# Patient Record
Sex: Female | Born: 1935 | Race: White | Hispanic: No | Smoking: Never smoker
Health system: Southern US, Community
[De-identification: ages and names within clinical notes are randomized; demographics above are authoritative.]

## PROBLEM LIST (undated history)

## (undated) DIAGNOSIS — S329XXA Fracture of unspecified parts of lumbosacral spine and pelvis, initial encounter for closed fracture: Secondary | ICD-10-CM

## (undated) DIAGNOSIS — I10 Essential (primary) hypertension: Secondary | ICD-10-CM

## (undated) DIAGNOSIS — I5031 Acute diastolic (congestive) heart failure: Secondary | ICD-10-CM

## (undated) DIAGNOSIS — I639 Cerebral infarction, unspecified: Secondary | ICD-10-CM

## (undated) DIAGNOSIS — I4891 Unspecified atrial fibrillation: Secondary | ICD-10-CM

## (undated) DIAGNOSIS — M1990 Unspecified osteoarthritis, unspecified site: Secondary | ICD-10-CM

## (undated) DIAGNOSIS — E785 Hyperlipidemia, unspecified: Secondary | ICD-10-CM

## (undated) DIAGNOSIS — R7302 Impaired glucose tolerance (oral): Secondary | ICD-10-CM

## (undated) HISTORY — DX: Impaired glucose tolerance (oral): R73.02

## (undated) HISTORY — DX: Essential (primary) hypertension: I10

## (undated) HISTORY — DX: Acute diastolic (congestive) heart failure: I50.31

## (undated) HISTORY — DX: Unspecified osteoarthritis, unspecified site: M19.90

## (undated) HISTORY — DX: Unspecified atrial fibrillation: I48.91

## (undated) HISTORY — DX: Hyperlipidemia, unspecified: E78.5

---

## 1999-05-14 HISTORY — PX: CATARACT EXTRACTION W/ INTRAOCULAR LENS  IMPLANT, BILATERAL: SHX1307

## 2007-02-07 ENCOUNTER — Inpatient Hospital Stay (HOSPITAL_COMMUNITY): Admission: EM | Admit: 2007-02-07 | Discharge: 2007-02-15 | Payer: Self-pay | Admitting: Emergency Medicine

## 2007-02-21 ENCOUNTER — Inpatient Hospital Stay (HOSPITAL_COMMUNITY): Admission: EM | Admit: 2007-02-21 | Discharge: 2007-03-05 | Payer: Self-pay | Admitting: Emergency Medicine

## 2007-02-23 ENCOUNTER — Encounter (INDEPENDENT_AMBULATORY_CARE_PROVIDER_SITE_OTHER): Payer: Self-pay | Admitting: Gastroenterology

## 2007-03-02 ENCOUNTER — Encounter (INDEPENDENT_AMBULATORY_CARE_PROVIDER_SITE_OTHER): Payer: Self-pay | Admitting: General Surgery

## 2007-03-02 HISTORY — PX: LAPAROSCOPIC CHOLECYSTECTOMY: SUR755

## 2007-03-06 ENCOUNTER — Inpatient Hospital Stay (HOSPITAL_COMMUNITY): Admission: EM | Admit: 2007-03-06 | Discharge: 2007-03-18 | Payer: Self-pay | Admitting: Emergency Medicine

## 2007-03-08 ENCOUNTER — Encounter (INDEPENDENT_AMBULATORY_CARE_PROVIDER_SITE_OTHER): Payer: Self-pay | Admitting: Gastroenterology

## 2007-03-24 ENCOUNTER — Inpatient Hospital Stay (HOSPITAL_COMMUNITY): Admission: EM | Admit: 2007-03-24 | Discharge: 2007-03-26 | Payer: Self-pay | Admitting: Endocrinology

## 2010-05-01 ENCOUNTER — Encounter (INDEPENDENT_AMBULATORY_CARE_PROVIDER_SITE_OTHER): Payer: Self-pay | Admitting: Internal Medicine

## 2010-05-01 ENCOUNTER — Ambulatory Visit: Payer: Self-pay | Admitting: Internal Medicine

## 2010-05-01 ENCOUNTER — Inpatient Hospital Stay (HOSPITAL_COMMUNITY): Admission: EM | Admit: 2010-05-01 | Discharge: 2010-05-11 | Payer: Self-pay | Admitting: Emergency Medicine

## 2010-05-03 ENCOUNTER — Encounter (INDEPENDENT_AMBULATORY_CARE_PROVIDER_SITE_OTHER): Payer: Self-pay | Admitting: Internal Medicine

## 2010-05-04 ENCOUNTER — Encounter (INDEPENDENT_AMBULATORY_CARE_PROVIDER_SITE_OTHER): Payer: Self-pay | Admitting: Internal Medicine

## 2010-05-24 DIAGNOSIS — E739 Lactose intolerance, unspecified: Secondary | ICD-10-CM

## 2010-05-24 DIAGNOSIS — M1990 Unspecified osteoarthritis, unspecified site: Secondary | ICD-10-CM | POA: Insufficient documentation

## 2010-05-24 DIAGNOSIS — M810 Age-related osteoporosis without current pathological fracture: Secondary | ICD-10-CM | POA: Insufficient documentation

## 2010-05-24 DIAGNOSIS — I10 Essential (primary) hypertension: Secondary | ICD-10-CM | POA: Insufficient documentation

## 2010-05-24 DIAGNOSIS — E785 Hyperlipidemia, unspecified: Secondary | ICD-10-CM

## 2010-05-26 ENCOUNTER — Ambulatory Visit: Payer: Self-pay | Admitting: Internal Medicine

## 2010-05-26 ENCOUNTER — Encounter: Payer: Self-pay | Admitting: Physician Assistant

## 2010-05-26 DIAGNOSIS — I4891 Unspecified atrial fibrillation: Secondary | ICD-10-CM

## 2010-05-26 DIAGNOSIS — I10 Essential (primary) hypertension: Secondary | ICD-10-CM | POA: Insufficient documentation

## 2010-05-26 DIAGNOSIS — R609 Edema, unspecified: Secondary | ICD-10-CM | POA: Insufficient documentation

## 2010-05-26 DIAGNOSIS — I5033 Acute on chronic diastolic (congestive) heart failure: Secondary | ICD-10-CM

## 2010-05-31 LAB — CONVERTED CEMR LAB
Calcium: 10.4 mg/dL (ref 8.4–10.5)
Chloride: 105 meq/L (ref 96–112)
Creatinine, Ser: 1.3 mg/dL — ABNORMAL HIGH (ref 0.4–1.2)
Potassium: 4.3 meq/L (ref 3.5–5.1)
Sodium: 144 meq/L (ref 135–145)

## 2010-06-01 ENCOUNTER — Encounter: Payer: Self-pay | Admitting: Cardiology

## 2010-06-01 LAB — CONVERTED CEMR LAB
POC INR: 2.2
Prothrombin Time: 22 s

## 2010-06-07 ENCOUNTER — Telehealth: Payer: Self-pay | Admitting: Internal Medicine

## 2010-06-08 ENCOUNTER — Encounter: Payer: Self-pay | Admitting: Cardiovascular Disease

## 2010-06-17 ENCOUNTER — Encounter: Payer: Self-pay | Admitting: Internal Medicine

## 2010-06-17 LAB — CONVERTED CEMR LAB: Prothrombin Time: 19.1 s

## 2010-06-23 ENCOUNTER — Encounter: Payer: Self-pay | Admitting: Cardiology

## 2010-06-23 LAB — CONVERTED CEMR LAB
POC INR: 1.9
Prothrombin Time: 18.8 s

## 2010-06-24 ENCOUNTER — Encounter: Payer: Self-pay | Admitting: Cardiology

## 2010-06-24 ENCOUNTER — Ambulatory Visit: Payer: Self-pay | Admitting: Internal Medicine

## 2010-06-25 LAB — CONVERTED CEMR LAB
BUN: 22 mg/dL (ref 6–23)
CO2: 31 meq/L (ref 19–32)
Calcium: 9.1 mg/dL (ref 8.4–10.5)
Creatinine, Ser: 1.4 mg/dL — ABNORMAL HIGH (ref 0.4–1.2)
GFR calc non Af Amer: 36.7 mL/min (ref >60)
Sodium: 142 meq/L (ref 135–145)

## 2010-06-28 ENCOUNTER — Telehealth: Payer: Self-pay | Admitting: Internal Medicine

## 2010-06-30 ENCOUNTER — Telehealth: Payer: Self-pay | Admitting: Internal Medicine

## 2010-07-01 ENCOUNTER — Encounter: Payer: Self-pay | Admitting: Cardiovascular Disease

## 2010-07-01 LAB — CONVERTED CEMR LAB: Prothrombin Time: 19.6 s

## 2010-07-07 ENCOUNTER — Encounter: Payer: Self-pay | Admitting: Internal Medicine

## 2010-07-16 ENCOUNTER — Ambulatory Visit: Payer: Self-pay | Admitting: Cardiology

## 2010-07-16 ENCOUNTER — Ambulatory Visit: Payer: Self-pay | Admitting: Internal Medicine

## 2010-07-16 LAB — CONVERTED CEMR LAB: POC INR: 1.7

## 2010-07-30 ENCOUNTER — Ambulatory Visit: Payer: Self-pay | Admitting: Cardiology

## 2010-07-30 LAB — CONVERTED CEMR LAB: POC INR: 2.2

## 2010-08-09 ENCOUNTER — Encounter: Payer: Self-pay | Admitting: Internal Medicine

## 2010-08-09 ENCOUNTER — Telehealth: Payer: Self-pay | Admitting: Internal Medicine

## 2010-10-13 NOTE — Assessment & Plan Note (Signed)
Summary: per check out/sf   Visit Type:  Follow-up Primary Geremy Rister:  dr Casimiro Needle altiemer  CC:  no complaints.  History of Present Illness: Pateint is a 77 year old who was recently hospitalized with pneumonia.  She also has a history of atrial fibrillation an diastolic CHF.  She was seen by Leda Gauze after D/C because of increased swelling and increased wt gain.  Her lasix was increased from 20 to 40 mg Per day. SInce then she has done well.  No SOB.  NO edema.  NO chest pains.  No dizziness.  Current Medications (verified): 1)  Furosemide 40 Mg Tabs (Furosemide) .... Please Take One Pill Daily By Mouth. 2)  Multi Vit .Marland Kitchen.. 1 Tab Once Daily 3)  Amlodipine Besylate 5 Mg Tabs (Amlodipine Besylate) .... Take One Tablet By Mouth Daily 4)  Tums .... As Needed 5)  Colace .... Prn 6)  Hydralazine Hcl 25 Mg Tabs (Hydralazine Hcl) .Marland Kitchen.. 1 Tab Q 6hours 7)  Dulcolax Supp .... As Needed 8)  Potassium Chloride Crys Cr 20 Meq Cr-Tabs (Potassium Chloride Crys Cr) .... Take One Tablet By Mouth Daily 9)  Lotensin 20 Mg Tabs (Benazepril Hcl) .... Please Take Two Pills in The Morning and Take One Pill At Night. 10)  Warfarin Sodium 2.5 Mg Tabs (Warfarin Sodium) .... Use As Directed By Anticoagualtion Clinic  Allergies (verified): 1)  ! * Benicar  Past History:  Past Surgical History: Last updated: 06-05-2010 laparoscopic       cholecystectomy on March 02, 2007.   Family History: Last updated: 06-05-10  Mother died at age 44 of stroke. Brother died age 31 of   MI. She has 1 child.   Social History: Last updated: Jun 05, 2010 She is a widow. She lives alone. Her daughter Aurea Graff and   others call or check on her daily. With the recent illness, her daughter   was staying with her at night, at last until yesterday. She does not   smoke or drink alcoho  Past Medical History:  1. Hypertension.   2. Hyperlipidemia.   3. Glucose intolerance.   4. Osteoporosis.   5. Osteoarthritis.   6. Atrial  fibrillation  7.  Diastolic CHF     Vital Signs:  Patient profile:   77 year old female Height:      62 inches Weight:      110 pounds BMI:     20.19 Pulse rate:   64 / minute BP sitting:   163 / 71  (left arm) Cuff size:   regular  Vitals Entered By: Burnett Kanaris, CNA (June 24, 2010 2:51 PM)  Physical Exam  Additional Exam:  Patient is in NAD HEENT:  Normocephalic, atraumatic. EOMI, PERRLA.  Neck: JVP is normal. No thyromegaly.  Lungs: clear to auscultation. No rales no wheezes.  Heart: Irregularly rate and rhythm. Normal S1, S2. No S3.   No significant murmurs. PMI not displaced.  Abdomen:  Supple, nontender. Normal bowel sounds. No masses. No hepatomegaly.  Extremities:   Good distal pulses throughout. No lower extremity edema.  Musculoskeletal :moving all extremities.  Neuro:   alert and oriented x3.    EKG  Procedure date:  06/24/2010  Findings:      Atrial fibrilllation.  65 bpm.   Possible anterior wall MI.  Impression & Recommendations:  Problem # 1:  DIASTOLIC HEART FAILURE, ACUTE ON CHRONIC (ICD-428.33) Volume does look good on exam today.  I would check a bmet today.  Otherwise no changes.  Problem # 2:  ESSENTIAL HYPERTENSION, BENIGN (ICD-401.1) Continue meds.  Problem # 3:  ATRIAL FIBRILLATION (ICD-427.31) Rate controlled.  Now on coumadin.  will need to establish in coumadin clinic.  Has been followed by home health til now.  Problem # 4:  HYPERLIPIDEMIA (ICD-272.4) Will need to review.  Problem # 5:  GLUCOSE INTOLERANCE (ICD-271.3) Followed by Dr. Leslie Dales.  Other Orders: EKG w/ Interpretation (93000) TLB-BMP (Basic Metabolic Panel-BMET) (80048-METABOL) [address] Cou[facility]in Clinic Referral (Coumadin clinic)  Patient Instructions: 1)  Your physician recommends that you return for lab work in: bmet today...we will call you with results. 2)  Your physician wants you to follow-up in: 6 months with Dr.Ross   You will receive a reminder  letter in the mail two months in advance. If you don't receive a letter, please call our office to schedule the follow-up appointment. 3)  You have been referred to Coumadin Clinic in 3 weeks  Appended Document: per check out/sf If home health sees or when comes in for INR have set up for lipids.  Appended Document: per check out/sf Called patient ....she will have fasting lipid panel completed on 07/16/2010.

## 2010-10-13 NOTE — Progress Notes (Signed)
Summary: Pt wants to stop Coumadin   ---- Converted from flag ---- ---- 08/03/2010 12:11 PM, Layne Benton, RN, BSN wrote: I called the daughter and she is aware of below. She will discuss again with her Mom the Coumadin vs Asa.  ---- 08/02/2010 5:59 AM, Sherrill Raring, MD, [facility] wrote: Annice Pih Please tell daughter with her kidney function not candidate for Pradaxa. In elderly when INR is labile and risk of fall reasonable to switch to ecASA 325mg  understanding there is increased risk of CVA.  Balancing risk/benefit.  ---- 07/30/2010 12:59 PM, Eda Keys wrote: Daughter has an interest in switching pt's anticoagulation therapy to just aspirin. Pradaxa would not be a option for this pt as she has reduced renal function. Pt has a hard time attending visits to coag clinic and is currently unstable on warfarin therapy. Please contact daughter, Lauro Regulus, at [phone] to discuss possible change in therapy. Thank you. ------------------------------  Phone Note Call from Patient   Caller: Daughter Summary of Call: Pt's daughter called.  She has discussed options with pt.  Pt does not want to take Coumadin any longer and is aware of the slightly higher risk of CVA with ASA versus Coumadin. She would like to switch to ASA at this time.  Instructed pt to stop Coumadin today then begin ECASA 325mg  tomorrow.  Will cancel future Coumadin Clinic appts.  Initial call taken by: Weston Brass PharmD,  August 09, 2010 9:06 AM    New/Updated Medications: ASPIRIN EC 325 MG TBEC (ASPIRIN) Take one tablet by mouth daily

## 2010-10-13 NOTE — Letter (Signed)
Summary: [facility] [place] [facility] Verbal Orders  [facility] [place] [facility]bal [facility]   Imported By: Marylou Mccoy 07/26/2010 10:46:48  _____________________________________________________________________  External Attachment:    Type:   Image     Comment:   External Document

## 2010-10-13 NOTE — Medication Information (Signed)
Summary: rov/sl  Anticoagulant Therapy  Managed by: Weston Brass, PharmD Referring MD: Tenny Craw PCP: dr Casimiro Needle altiemer Supervising MD: Nahser Indication 1: Atrial Fibrillation Lab Used: [facility]  Site: [address] INR POC 2.2 INR RANGE 2.0-3.0  Dietary changes: no    Health status changes: no    Bleeding/hemorrhagic complications: no    Recent/future hospitalizations: no    Any changes in medication regimen? no    Recent/future dental: no  Any missed doses?: no       Is patient compliant with meds? yes      Comments: Daughter is requesting alternate therapy for anticoagulation  Allergies: 1)  ! * Benicar  Anticoagulation Management History:      The patient is taking warfarin and comes in today for a routine follow up visit.  Positive risk factors for bleeding include an age of 75 years or older.  The bleeding index is 'intermediate risk'.  Positive CHADS2 values include History of CHF, History of HTN, and Age > 75 years old.  Anticoagulation responsible Louden Houseworth: Nahser.  INR POC: 2.2.  Cuvette Lot#: [id number].  Exp: 08/2011.    Anticoagulation Management Assessment/Plan:      The patient's current anticoagulation dose is Warfarin sodium 2.5 mg tabs: Use as directed by Anticoagualtion Clinic.  The target INR is 2.0-3.0.  The next INR is due 08/13/2010.  Anticoagulation instructions were given to patient/daughter.  Results were reviewed/authorized by Weston Brass, PharmD.  She was notified by Hoy Register, PharmD Candidate.         Prior Anticoagulation Instructions: INR 1.7  Tomorrow, take Coumadin 2 tabs (5 mg).  Then, continue taking Coumadin 1 tab (2.5 mg) all days except Coumadin 1.5 tabs (3.75 mg). Return to clinic in 2 weeks.   Current Anticoagulation Instructions: INR 2.2 Continue previous dose of 1 tablet everyday except 1.5 on Thursday Recheck INR in 3 weeks

## 2010-10-13 NOTE — Medication Information (Signed)
Summary: Coumadin Clinic  Anticoagulant Therapy  Managed by: Weston Brass, PharmD Referring MD: Tenny Craw PCP: [name] Supervising MD: Daleen Squibb MD, Maisie Fus Indication 1: Atrial Fibrillation Lab Used: [facility] [place] Site: [address] PT 22 INR POC 2.2 INR RANGE 2.0-3.0  Dietary changes: no    Health status changes: no    Bleeding/hemorrhagic complications: no    Recent/future hospitalizations: yes       Details: new onset afib during last hospitalization.  Went to long term care center after discharge.  Now at home.   Any changes in medication regimen? no    Recent/future dental: no  Any missed doses?: no       Is patient compliant with meds? yes      Comments: Discharged from long term care on 9/18.   Current Medications (verified): 1)  Furosemide 40 Mg Tabs (Furosemide) .... Please Take One Pill Daily By Mouth. 2)  Multi Vit .Marland Kitchen.. 1 Tab Once Daily 3)  Amlodipine Besylate 5 Mg Tabs (Amlodipine Besylate) .... Take One Tablet By Mouth Daily 4)  Tums .... As Needed 5)  Colace .... Prn 6)  Hydralazine Hcl 25 Mg Tabs (Hydralazine Hcl) .Marland Kitchen.. 1 Tab Q 6hours 7)  Dulcolax Supp .... As Needed 8)  Potassium Chloride Crys Cr 20 Meq Cr-Tabs (Potassium Chloride Crys Cr) .... Take One Tablet By Mouth Daily 9)  Lotensin 20 Mg Tabs (Benazepril Hcl) .... Please Take Two Pills in The Morning and Take One Pill At Night. 10)  Warfarin Sodium 2.5 Mg Tabs (Warfarin Sodium) .... Use As Directed By Anticoagualtion Clinic  Allergies: 1)  ! * Benicar  Anticoagulation Management History:      Her anticoagulation is being managed by telephone today.  Positive risk factors for bleeding include an age of 49 years or older.  The bleeding index is 'intermediate risk'.  Positive CHADS2 values include History of CHF, History of HTN, and Age > 67 years old.  Prothrombin time is 22.  Anticoagulation responsible provider: Daleen Squibb MD, Maisie Fus.  INR POC: 2.2.    Anticoagulation Management  Assessment/Plan:      The patient's current anticoagulation dose is Warfarin sodium 2.5 mg tabs: Use as directed by Anticoagualtion Clinic.  The target INR is 2.0-3.0.  The next INR is due 06/08/2010.  Anticoagulation instructions were given to home health nurse.  Results were reviewed/authorized by Weston Brass, PharmD.  She was notified by Weston Brass PharmD.         Current Anticoagulation Instructions: INR 2.2  Continue same dose of 1 tablet every day.  Recheck INR in 1 week.  Orders given to Tim, RN while in pt's home.

## 2010-10-13 NOTE — Letter (Signed)
Summary: [facility] - Office Note  [facility] - Office Note   Imported By: Marylou Mccoy 08/09/2010 10:16:31  _____________________________________________________________________  External Attachment:    Type:   Image     Comment:   External Document

## 2010-10-13 NOTE — Progress Notes (Signed)
Summary: need new prescription  Phone Note Refill Request Message from:  Patient on June 30, 2010 4:38 PM  Refills Requested: Medication #1:  HYDRALAZINE HCL 25 MG TABS 1 tab q 6hours [phone] pt need another prescription for this medication for a 30day supply which is 120 pills  Initial call taken by: Judie Grieve,  June 30, 2010 4:41 PM    Prescriptions: HYDRALAZINE HCL 25 MG TABS (HYDRALAZINE HCL) 1 tab q 6hours  #120 x 6   Entered by:   Burnett Kanaris, CNA   Authorized by:   Sherrill Raring, MD, [facility]   Signed by:   Burnett Kanaris, CNA on 07/01/2010   Method used:   [facility]        [facility]  [address] [phone][facility] (retail)       [address]       [place], [place]  [address]       Ph: [phone]       Fax: [phone]   RxID:   [id number]

## 2010-10-13 NOTE — Medication Information (Signed)
Summary: Coumadin Clinic  Anticoagulant Therapy  Managed by: Inactive Referring MD: Tenny Craw PCP: dr Casimiro Needle altiemer Supervising MD: Nahser Indication 1: Atrial Fibrillation Lab Used: [facility] [place] Site: [address] INR RANGE 2.0-3.0          Comments: Pt switched to ASA 325mg  per Dr. Tenny Craw.   Allergies: 1)  ! * Benicar  Anticoagulation Management History:      Positive risk factors for bleeding include an age of 75 years or older.  The bleeding index is 'intermediate risk'.  Positive CHADS2 values include History of CHF, History of HTN, and Age > 40 years old.  Anticoagulation responsible provider: Nahser.  Exp: 08/2011.    Anticoagulation Management Assessment/Plan:      The target INR is 2.0-3.0.  The next INR is due 08/13/2010.  Anticoagulation instructions were given to patient/daughter.  Results were reviewed/authorized by Inactive.         Prior Anticoagulation Instructions: INR 2.2 Continue previous dose of 1 tablet everyday except 1.5 on Thursday Recheck INR in 3 weeks

## 2010-10-13 NOTE — Progress Notes (Signed)
Summary: refill request  Phone Note Refill Request Message from:  Patient on June 07, 2010 2:41 PM  Refills Requested: Medication #1:  WARFARIN SODIUM 2.5 MG TABS Use as directed by Anticoagualtion Clinic. per pt's dtr thought warfarin was 1mg [facility] [address]   Method Requested: Telephone to Pharmacy Initial call taken by: Glynda Jaeger,  June 07, 2010 2:42 PM    Prescriptions: WARFARIN SODIUM 2.5 MG TABS (WARFARIN SODIUM) Use as directed by Anticoagualtion Clinic  #35 x 3   Entered by:   Bethena Midget, RN, BSN   Authorized by:   Sherrill Raring, MD, [facility]   Signed by:   Bethena Midget, RN, BSN on 06/08/2010   Method used:   [facility]        [facility]  [address] [id number]       [address]       [place], [place]  [address]       Ph: [phone]       Fax: [phone]   RxID:   [id number]

## 2010-10-13 NOTE — Medication Information (Signed)
Summary: Coumadin Clinic  Anticoagulant Therapy  Managed by: Bethena Midget, RN, BSN Referring MD: Tenny Craw PCP: dr Casimiro Needle altiemer Supervising MD: Eden Emms MD, Theron Arista Indication 1: Atrial Fibrillation Lab Used: [facility] [place] Site: [address] PT 19.6 INR POC 2.2 INR RANGE 2.0-3.0  Dietary changes: no    Health status changes: no    Bleeding/hemorrhagic complications: no    Recent/future hospitalizations: no    Any changes in medication regimen? no    Recent/future dental: no  Any missed doses?: no       Is patient compliant with meds? yes      Comments: [facility] discharging pt today. Tim [facility] RN called states PT/INR drawn today veinipuncture, will await results.  Cloyde Reams RN  July 01, 2010 9:32 AM   Allergies: 1)  ! * Benicar  Anticoagulation Management History:      Her anticoagulation is being managed by telephone today.  Positive risk factors for bleeding include an age of 2 years or older.  The bleeding index is 'intermediate risk'.  Positive CHADS2 values include History of CHF, History of HTN, and Age > 75 years old.  Prothrombin time is 19.6.  Anticoagulation responsible Cadden Elizondo: Eden Emms MD, Theron Arista.  INR POC: 2.2.    Anticoagulation Management Assessment/Plan:      The patient's current anticoagulation dose is Warfarin sodium 2.5 mg tabs: Use as directed by Anticoagualtion Clinic.  The target INR is 2.0-3.0.  The next INR is due 07/16/2010.  Anticoagulation instructions were given to patient/daughter.  Results were reviewed/authorized by Bethena Midget, RN, BSN.  She was notified by Bethena Midget, RN, BSN.         Prior Anticoagulation Instructions: INR 1.9  Spoke with pt.  Continue same dose of 1 tablet every day except 1 1/2 tablets on Thursday.  Recheck INR in 1 week.  Orders given to [facility] with [facility].    Current Anticoagulation Instructions: INR 2.2 Continue 2.5mg s daily except 3.75mg s on Thursdays. Recheck in 2 weeks.

## 2010-10-13 NOTE — Medication Information (Signed)
Summary: Coumadin Clinic  Anticoagulant Therapy  Managed by: Cloyde Reams, RN, BSN Referring MD: Tenny Craw PCP: [name] Supervising MD: Eden Emms MD, Theron Arista Indication 1: Atrial Fibrillation Lab Used: [facility] [place] Site: [address] PT 22.2 INR POC 2.2 INR RANGE 2.0-3.0    Bleeding/hemorrhagic complications: no     Any changes in medication regimen? no     Any missed doses?: no       Is patient compliant with meds? yes       Allergies: 1)  ! * Benicar  Anticoagulation Management History:      The patient is taking warfarin and comes in today for a routine follow up visit.  Positive risk factors for bleeding include an age of 75 years or older.  The bleeding index is 'intermediate risk'.  Positive CHADS2 values include History of CHF, History of HTN, and Age > 6 years old.  Prothrombin time is 22.2.  Anticoagulation responsible provider: Eden Emms MD, Theron Arista.  INR POC: 2.2.    Anticoagulation Management Assessment/Plan:      The patient's current anticoagulation dose is Warfarin sodium 2.5 mg tabs: Use as directed by Anticoagualtion Clinic.  The target INR is 2.0-3.0.  The next INR is due 06/08/2010.  Anticoagulation instructions were given to home health nurse/pt.  Results were reviewed/authorized by Cloyde Reams, RN, BSN.  She was notified by Cloyde Reams, RN, BSN.         Prior Anticoagulation Instructions: INR 2.2  Continue same dose of 1 tablet every day.  Recheck INR in 1 week.  Orders given to Tim, RN while in pt's home.   Current Anticoagulation Instructions: INR 2.2  Spoke with [facility], advised to continue pt on same dosage and recheck PT/INR in 1 week.

## 2010-10-13 NOTE — Letter (Signed)
Summary: [facility] [place] - Physician Verbal Order  [facility] [place] - Physician Verbal Order   Imported By: Marylou Mccoy 07/26/2010 07:53:44  _____________________________________________________________________  External Attachment:    Type:   Image     Comment:   External Document

## 2010-10-13 NOTE — Assessment & Plan Note (Signed)
Summary: eph/ gd   Visit Type:  Follow-up Primary Oisin Yoakum:  [name]   History of Present Illness: visit a 77 year old white female patient was recently hospitalized with an acute respiratory failure which was multifactorial. She was found to have community-acquired pneumonia, new-onset atrial fibrillation with slow ventricular rate, and acute on chronic diastolic heart failure. She also has a history of pulmonary hypertension. She also has a history of acute renal failure on chronic kidney disease stage III do to diuresis.  The patient was discharged home to a rehabilitation facility. Her daughter noticed she had increased leg edema last week and she has not been weighed. She was discharged at 106 pounds and was up to 118 pounds. She denies shortness of breath, chest pain, palpitations, dizziness, or presyncope. She hopes to move back home within the next week from the rehabilitation facility. Her daughter lives next door and is very attentive.  Current Medications (verified): 1)  Lotensin 40 Mg Tabs (Benazepril Hcl) .Marland Kitchen.. 1 Tab Once Daily 2)  Lasix .... As Directed 3)  Multi Vit .Marland Kitchen.. 1 Tab Once Daily 4)  Amlodipine Besylate 10 Mg Tabs (Amlodipine Besylate) .... Take One Tablet By Mouth Daily 5)  Tums .... As Needed 6)  Colace .... Prn 7)  Hydralazine Hcl 25 Mg Tabs (Hydralazine Hcl) .Marland Kitchen.. 1 Tab Q 6hours 8)  Warfarin Sodium 1 Mg Tabs (Warfarin Sodium) .... Use As Directed By Anticoagualtion Clinic 9)  Dulcolax Supp .... As Needed  Allergies (verified): 1)  ! * Benicar  Past History:  Past Medical History: Last updated: 05/24/2010  1. Hypertension.   2. Hyperlipidemia.   3. Glucose intolerance.   4. Osteoporosis.   5. Osteoarthritis.      Social History: Reviewed history from 05/24/2010 and no changes required. She is a widow. She lives alone. Her daughter Aurea Graff and   others call or check on her daily. With the recent illness, her daughter   was staying with her at night,  at last until yesterday. She does not   smoke or drink alcoho  Review of Systems       see the history of present illness  Vital Signs:  Patient profile:   77 year old female Height:      62 inches Weight:      116 pounds BMI:     21.29 Pulse rate:   71 / minute BP sitting:   173 / 63  (left arm) Cuff size:   regular  Vitals Entered By: Burnett Kanaris, CNA (May 26, 2010 1:10 PM)  Physical Exam  General:  Thin, young looking 89 yr.old, in no acute distress. Neck: No JVD, HJR, Bruit, or thyroid enlargement Lungs: Decreased breath sounds throughout with fine crackles half way up her lung bases,No tachypnea, clear without wheezing, rales, or rhonchi Cardiovascular: irregular irregular, PMI not displaced, heart sounds normal, 2/6 systolic murmur at the left sternal border,no  gallops, bruit, thrill, or heave. Abdomen: BS normal. Soft without organomegaly, masses, lesions or tenderness. Extremities: +2-3 bilateral ankle edema,without cyanosis, clubbing. Good distal pulses bilateral SKin: Warm, no lesions or rashes  Musculoskeletal: No deformities Neuro: no focal signs    EKG  Procedure date:  05/26/2010  Findings:      Atrial fibrillation, with nonspecific ST-T wave changes no acute change  Impression & Recommendations:  Problem # 1:  DIASTOLIC HEART FAILURE, ACUTE ON CHRONIC (ICD-428.33) Patient has gained over 12 pounds since hospitalization. They did stop her hydrochlorothiazide and Place her on Lasix 20  mg daily at the nursing home. I think she needs more Lasix than this to help rid her of her excess of fluid. I will increase her Lasix to 40 mg daily and add K-Dur 20 milliequivalents daily. We will check a BMET and BNP today and repeat in 1 week. Her updated medication list for this problem includes:    Furosemide 40 Mg Tabs (Furosemide) .Marland Kitchen... Please take one pill daily by mouth.    Amlodipine Besylate 5 Mg Tabs (Amlodipine besylate) .Marland Kitchen... Take one tablet by mouth  daily    Warfarin Sodium 1 Mg Tabs (Warfarin sodium) ..... Use as directed by anticoagualtion clinic    Lotensin 20 Mg Tabs (Benazepril hcl) .Marland Kitchen... Please take two pills in the morning and take one pill at night.  Problem # 2:  EDEMA (ICD-782.3) add Lasix and TED hose. I also decreased her Norvasc to 5 mg once daily. Orders: TLB-BMP (Basic Metabolic Panel-BMET) (80048-METABOL) TLB-BNP (B-Natriuretic Peptide) (83880-BNPR)  Problem # 3:  ATRIAL FIBRILLATION (ICD-427.31) Patient's ventricular rate is controlled. She is on Coumadin. She will be monitored by our Coumadin clinic when she leaves the rest home. Her updated medication list for this problem includes:    Warfarin Sodium 1 Mg Tabs (Warfarin sodium) ..... Use as directed by anticoagualtion clinic  Orders: EKG w/ Interpretation (93[address]) [address] [facility] Referral (Coumadin clinic)  Problem # 4:  HYPERTENSION (ICD-401.9) Blood pressure is up. I will increase her Lotensin and. Her updated medication list for this problem includes:    Furosemide 40 Mg Tabs (Furosemide) .Marland Kitchen... Please take one pill daily by mouth.    Amlodipine Besylate 5 Mg Tabs (Amlodipine besylate) .Marland Kitchen... Take one tablet by mouth daily    Hydralazine Hcl 25 Mg Tabs (Hydralazine hcl) .Marland Kitchen... 1 tab q 6hours    Lotensin 20 Mg Tabs (Benazepril hcl) .Marland Kitchen... Please take two pills in the morning and take one pill at night.  Orders: TLB-BMP (Basic Metabolic Panel-BMET) (80048-METABOL) TLB-BNP (B-Natriuretic Peptide) (83880-BNPR)  Patient Instructions: 1)  Your physician recommends that you schedule a follow-up appointment in: 1 month with Dr. Tenny Craw 2)  Your physician recommends that you return for lab work in 1 weekl for a BMP and BNP and that you have lab work today. 427.31/401.1/wpa 3)  Your physician has recommended you make the following change in your medication:  4)  INCREASE LASIX to 40mg  by mouth daily, START taking potassium by mouth daily, DECREASE your  Amlodipine to 5mg  by mouth daily, INCREASE your Lotensin to 40mg  by mouth in the morning and  20mg  by mouth in the evening.  5)  Please start wearing TED hose. 6)  You have been referred to the coumadin clinic.  Prescriptions: LOTENSIN 20 MG TABS (BENAZEPRIL HCL) Please take two pills in the morning and take one pill at night.  #90 x 6   Entered by:   Ellender Hose RN   Authorized by:   Marletta Lor, PA-C   Signed by:   Ellender Hose RN on 05/26/2010   Method used:   [facility] to        [id number]       [address]       [place], [place]  [address]       Ph: [phone]       Fax: [phone]   RxID:   [id number] AMLODIPINE BESYLATE 5 MG TABS (AMLODIPINE BESYLATE)  Take one tablet by mouth daily  #30 x 6   Entered by:   Ellender Hose RN   Authorized by:   Marletta Lor, PA-C   Signed by:   Ellender Hose RN on 05/26/2010   Method used:   [facility]ically to        [id number]       [address]       [place], [place]  [address]       Ph: [phone]       Fax: [phone]   RxID:   [id number] POTASSIUM CHLORIDE CRYS CR 20 MEQ CR-TABS (POTASSIUM CHLORIDE CRYS CR) Take one tablet by mouth daily  #30 x 6   Entered by:   Ellender Hose RN   Authorized by:   Marletta Lor, PA-C   Signed by:   Ellender Hose RN on 05/26/2010   Method used:   [facility] to        [id number]       [address]       [place], [place]  [address]       Ph: [phone]       Fax: [phone]   RxID:   [id number] FUROSEMIDE 40 MG TABS (FUROSEMIDE) Please take one pill daily by mouth.  #30 x 6   Entered by:   Ellender Hose RN   Authorized by:   Marletta Lor, PA-C   Signed by:   Ellender Hose RN on 05/26/2010   Method used:   [facility]ronically to        [id number]        [address]       [place], [place]  [address]       Ph: [phone]       Fax: [phone]   RxID:   [id number]

## 2010-10-13 NOTE — Medication Information (Signed)
Summary: Coumadin Clinic  Anticoagulant Therapy  Managed by: Cloyde Reams, RN, BSN Referring MD: Tenny Craw PCP: [name] Supervising MD: Johney Frame MD, Fayrene Fearing Indication 1: Atrial Fibrillation Lab Used: [facility] [place] Site: [address] PT 19.1 INR POC 1.59 INR RANGE 2.0-3.0  Dietary changes: yes       Details: Incr in vit K   Bleeding/hemorrhagic complications: no     Any changes in medication regimen? no     Any missed doses?: no       Is patient compliant with meds? yes       Allergies: 1)  ! * Benicar  Anticoagulation Management History:      Her anticoagulation is being managed by telephone today.  Positive risk factors for bleeding include an age of 76 years or older.  The bleeding index is 'intermediate risk'.  Positive CHADS2 values include History of CHF, History of HTN, and Age > 46 years old.  Prothrombin time is 19.1.  Anticoagulation responsible provider: Allred MD, Fayrene Fearing.  INR POC: 1.59.    Anticoagulation Management Assessment/Plan:      The patient's current anticoagulation dose is Warfarin sodium 2.5 mg tabs: Use as directed by Anticoagualtion Clinic.  The target INR is 2.0-3.0.  The next INR is due 06/23/2010.  Anticoagulation instructions were given to home health nurse/pt.  Results were reviewed/authorized by Cloyde Reams, RN, BSN.  She was notified by Cloyde Reams RN.        Coagulation management information includes: Debbie Spears- pt's daughter- [phone].  Prior Anticoagulation Instructions: INR 2.2  Spoke with [facility], advised to continue pt on same dosage and recheck PT/INR in 1 week.    Current Anticoagulation Instructions: INR 1.59  Called spoke with pt, then called Daughter Debbie Spears [phone]) who fixes pt's medications, advised to have pt start taking 1 tablet daily except 1.5 tablets on Thursdays.  Recheck in 1 week.  Called [facility] spoke with Ulice Brilliant RN advised to redraw in 1 week on 06/23/10.

## 2010-10-13 NOTE — Progress Notes (Signed)
Summary: refill request  Phone Note Refill Request Message from:  Patient on June 28, 2010 4:20 PM  Refills Requested: Medication #1:  HYDRALAZINE HCL 25 MG TABS 1 tab q 6hours [facility] [address] [address]   Method Requested: Telephone to Pharmacy Initial call taken by: Glynda Jaeger,  June 28, 2010 4:21 PM     Prescriptions: AMLODIPINE BESYLATE 5 MG TABS (AMLODIPINE BESYLATE) Take one tablet by mouth daily  #90 x 6   Entered by:   Burnett Kanaris, CNA   Authorized by:   Sherrill Raring, MD, [facility]   Signed by:   Burnett Kanaris, CNA on 06/29/2010   Method used:   [facility]        [facility] [address] [id number]       [address]       [place], [place]  [address]       Ph: [phone]       Fax: [phone]   RxID:   [id number] HYDRALAZINE HCL 25 MG TABS (HYDRALAZINE HCL) 1 tab q 6hours  #30 x 90   Entered by:   Burnett Kanaris, CNA   Authorized by:   Sherrill Raring, MD, [facility]   Signed by:   Burnett Kanaris, CNA on 06/29/2010   Method used:   [facility]        [facility] [address] [facility]       [address]       [place], [place]  [address]       Ph: [phone]       Fax: [phone]   RxID:   [id number] HYDRALAZINE HCL 25 MG TABS (HYDRALAZINE HCL) 1 tab q 6hours  #30 x 6   Entered by:   Burnett Kanaris, CNA   Authorized by:   Sherrill Raring, MD, [facility]   Signed by:   Burnett Kanaris, CNA on 06/29/2010   Method used:   [facility]        [facility]  [address] [phone][facility] (retail)       [address]       [place], [place]  [address]       Ph: [phone]       Fax: [phone]   RxID:   [id number]

## 2010-10-13 NOTE — Medication Information (Signed)
Summary: coumadin f/u 3wks. home health is no longer following her cou...  Anticoagulant Therapy  Managed by: Reina Fuse, PharmD Referring MD: Tenny Craw PCP: dr Casimiro Needle altiemer Supervising MD: Juanda Chance MD, Bruce Indication 1: Atrial Fibrillation Lab Used: [facility] [place] Site: [address] INR POC 1.7 INR RANGE 2.0-3.0  Dietary changes: no    Health status changes: no    Bleeding/hemorrhagic complications: no    Recent/future hospitalizations: no    Any changes in medication regimen? no    Recent/future dental: no  Any missed doses?: no       Is patient compliant with meds? yes       Allergies: 1)  ! * Benicar  Anticoagulation Management History:      The patient is taking warfarin and comes in today for a routine follow up visit.  Positive risk factors for bleeding include an age of 75 years or older.  The bleeding index is 'intermediate risk'.  Positive CHADS2 values include History of CHF, History of HTN, and Age > 62 years old.  Anticoagulation responsible provider: Juanda Chance MD, Smitty Cords.  INR POC: 1.7.  Cuvette Lot#: [id number].    Anticoagulation Management Assessment/Plan:      The patient's current anticoagulation dose is Warfarin sodium 2.5 mg tabs: Use as directed by Anticoagualtion Clinic.  The target INR is 2.0-3.0.  The next INR is due 07/30/2010.  Anticoagulation instructions were given to patient/daughter.  Results were reviewed/authorized by Reina Fuse, PharmD.  She was notified by Reina Fuse PharmD.         Prior Anticoagulation Instructions: INR 2.2 Continue 2.5mg s daily except 3.75mg s on Thursdays. Recheck in 2 weeks.   Current Anticoagulation Instructions: INR 1.7  Tomorrow, take Coumadin 2 tabs (5 mg).  Then, continue taking Coumadin 1 tab (2.5 mg) all days except Coumadin 1.5 tabs (3.75 mg). Return to clinic in 2 weeks.

## 2010-10-13 NOTE — Medication Information (Signed)
Summary: Coumadin Clinic  Anticoagulant Therapy  Managed by: Weston Brass, PharmD Referring MD: Tenny Craw PCP: [name] Supervising MD: Daleen Squibb MD, Maisie Fus Indication 1: Atrial Fibrillation Lab Used: [facility] [place] Site: [address] PT 18.8 INR POC 1.9 INR RANGE 2.0-3.0  Dietary changes: no    Health status changes: no    Bleeding/hemorrhagic complications: no    Recent/future hospitalizations: no    Any changes in medication regimen? no    Recent/future dental: no  Any missed doses?: no       Is patient compliant with meds? yes       Allergies: 1)  ! * Benicar  Anticoagulation Management History:      Her anticoagulation is being managed by telephone today.  Positive risk factors for bleeding include an age of 75 years or older.  The bleeding index is 'intermediate risk'.  Positive CHADS2 values include History of CHF, History of HTN, and Age > 27 years old.  Prothrombin time is 18.8.  Anticoagulation responsible Olimpia Tinch: Daleen Squibb MD, Maisie Fus.  INR POC: 1.9.    Anticoagulation Management Assessment/Plan:      The patient's current anticoagulation dose is Warfarin sodium 2.5 mg tabs: Use as directed by Anticoagualtion Clinic.  The target INR is 2.0-3.0.  The next INR is due 06/30/2010.  Anticoagulation instructions were given to home health nurse/pt.  Results were reviewed/authorized by Weston Brass, PharmD.  She was notified by Weston Brass PharmD.         Prior Anticoagulation Instructions: INR 1.59  Called spoke with pt, then called Daughter Aurea Graff [phone]) who fixes pt's medications, advised to have pt start taking 1 tablet daily except 1.5 tablets on Thursdays.  Recheck in 1 week.  Called [facility] spoke with Ulice Brilliant RN advised to redraw in 1 week on 06/23/10.   Current Anticoagulation Instructions: INR 1.9  Spoke with pt.  Continue same dose of 1 tablet every day except 1 1/2 tablets on Thursday.  Recheck INR in 1 week.  Orders given to [facility] with  [facility].

## 2010-10-19 ENCOUNTER — Encounter: Payer: Self-pay | Admitting: Internal Medicine

## 2010-11-18 NOTE — Letter (Signed)
Summary: [facility]  [facility]   Imported By: Marylou Mccoy 11/11/2010 15:39:11  _____________________________________________________________________  External Attachment:    Type:   Image     Comment:   External Document

## 2010-11-26 LAB — CBC
HCT: 37.7 % (ref 36.0–46.0)
HCT: 39.2 % (ref 36.0–46.0)
HCT: 39.6 % (ref 36.0–46.0)
Hemoglobin: 12.5 g/dL (ref 12.0–15.0)
Hemoglobin: 12.6 g/dL (ref 12.0–15.0)
Hemoglobin: 13.3 g/dL (ref 12.0–15.0)
Hemoglobin: 13.4 g/dL (ref 12.0–15.0)
Hemoglobin: 14 g/dL (ref 12.0–15.0)
MCH: 31.3 pg (ref 26.0–34.0)
MCH: 31.8 pg (ref 26.0–34.0)
MCHC: 33 g/dL (ref 30.0–36.0)
MCHC: 33.1 g/dL (ref 30.0–36.0)
MCHC: 33.6 g/dL (ref 30.0–36.0)
MCHC: 34.1 g/dL (ref 30.0–36.0)
MCHC: 34.2 g/dL (ref 30.0–36.0)
MCV: 94 fL (ref 78.0–100.0)
MCV: 94.7 fL (ref 78.0–100.0)
MCV: 94.8 fL (ref 78.0–100.0)
Platelets: 152 10*3/uL (ref 150–400)
Platelets: 155 10*3/uL (ref 150–400)
RBC: 4.01 MIL/uL (ref 3.87–5.11)
RBC: 4.22 MIL/uL (ref 3.87–5.11)
RBC: 4.36 MIL/uL (ref 3.87–5.11)
RDW: 14.3 % (ref 11.5–15.5)
RDW: 14.5 % (ref 11.5–15.5)
WBC: 8.9 10*3/uL (ref 4.0–10.5)
WBC: 9.3 10*3/uL (ref 4.0–10.5)

## 2010-11-26 LAB — CULTURE, BLOOD (ROUTINE X 2): Culture: NO GROWTH

## 2010-11-26 LAB — BASIC METABOLIC PANEL
BUN: 41 mg/dL — ABNORMAL HIGH (ref 6–23)
BUN: 41 mg/dL — ABNORMAL HIGH (ref 6–23)
CO2: 26 mEq/L (ref 19–32)
CO2: 28 mEq/L (ref 19–32)
CO2: 31 mEq/L (ref 19–32)
CO2: 32 mEq/L (ref 19–32)
CO2: 40 mEq/L — ABNORMAL HIGH (ref 19–32)
CO2: 42 mEq/L (ref 19–32)
Calcium: 9 mg/dL (ref 8.4–10.5)
Calcium: 9.2 mg/dL (ref 8.4–10.5)
Calcium: 9.3 mg/dL (ref 8.4–10.5)
Chloride: 100 mEq/L (ref 96–112)
Chloride: 102 mEq/L (ref 96–112)
Chloride: 102 mEq/L (ref 96–112)
Chloride: 103 mEq/L (ref 96–112)
Chloride: 104 mEq/L (ref 96–112)
Creatinine, Ser: 1.58 mg/dL — ABNORMAL HIGH (ref 0.4–1.2)
Creatinine, Ser: 1.75 mg/dL — ABNORMAL HIGH (ref 0.4–1.2)
Creatinine, Ser: 1.82 mg/dL — ABNORMAL HIGH (ref 0.4–1.2)
Creatinine, Ser: 1.86 mg/dL — ABNORMAL HIGH (ref 0.4–1.2)
GFR calc Af Amer: 31 mL/min — ABNORMAL LOW (ref >60)
GFR calc Af Amer: 33 mL/min — ABNORMAL LOW (ref >60)
GFR calc Af Amer: 37 mL/min — ABNORMAL LOW (ref >60)
GFR calc Af Amer: 39 mL/min — ABNORMAL LOW (ref >60)
GFR calc non Af Amer: 31 mL/min — ABNORMAL LOW (ref >60)
GFR calc non Af Amer: 31 mL/min — ABNORMAL LOW (ref >60)
GFR calc non Af Amer: 32 mL/min — ABNORMAL LOW (ref >60)
GFR calc non Af Amer: 35 mL/min — ABNORMAL LOW (ref >60)
GFR calc non Af Amer: 45 mL/min — ABNORMAL LOW (ref >60)
Glucose, Bld: 118 mg/dL — ABNORMAL HIGH (ref 70–99)
Glucose, Bld: 119 mg/dL — ABNORMAL HIGH (ref 70–99)
Glucose, Bld: 123 mg/dL — ABNORMAL HIGH (ref 70–99)
Glucose, Bld: 131 mg/dL — ABNORMAL HIGH (ref 70–99)
Glucose, Bld: 146 mg/dL — ABNORMAL HIGH (ref 70–99)
Glucose, Bld: 152 mg/dL — ABNORMAL HIGH (ref 70–99)
Potassium: 3.1 mEq/L — ABNORMAL LOW (ref 3.5–5.1)
Potassium: 3.1 mEq/L — ABNORMAL LOW (ref 3.5–5.1)
Potassium: 3.2 mEq/L — ABNORMAL LOW (ref 3.5–5.1)
Potassium: 3.4 mEq/L — ABNORMAL LOW (ref 3.5–5.1)
Potassium: 3.4 mEq/L — ABNORMAL LOW (ref 3.5–5.1)
Potassium: 3.4 mEq/L — ABNORMAL LOW (ref 3.5–5.1)
Potassium: 4.2 mEq/L (ref 3.5–5.1)
Sodium: 137 mEq/L (ref 135–145)
Sodium: 143 mEq/L (ref 135–145)
Sodium: 145 mEq/L (ref 135–145)
Sodium: 146 mEq/L — ABNORMAL HIGH (ref 135–145)
Sodium: 147 mEq/L — ABNORMAL HIGH (ref 135–145)
Sodium: 147 mEq/L — ABNORMAL HIGH (ref 135–145)
Sodium: 148 mEq/L — ABNORMAL HIGH (ref 135–145)

## 2010-11-26 LAB — POCT I-STAT 3, ART BLOOD GAS (G3+)
Acid-Base Excess: 2 mmol/L (ref 0.0–2.0)
Bicarbonate: 28.2 mEq/L — ABNORMAL HIGH (ref 20.0–24.0)
O2 Saturation: 93 %
TCO2: 30 mmol/L (ref 0–100)
pO2, Arterial: 70 mmHg — ABNORMAL LOW (ref 80.0–100.0)

## 2010-11-26 LAB — PROTIME-INR
INR: 0.97 (ref 0.00–1.49)
INR: 1.08 (ref 0.00–1.49)
INR: 1.12 (ref 0.00–1.49)
INR: 1.18 (ref 0.00–1.49)
Prothrombin Time: 12.9 seconds (ref 11.6–15.2)
Prothrombin Time: 13.1 seconds (ref 11.6–15.2)
Prothrombin Time: 13.3 seconds (ref 11.6–15.2)
Prothrombin Time: 14 seconds (ref 11.6–15.2)
Prothrombin Time: 14.2 seconds (ref 11.6–15.2)
Prothrombin Time: 14.6 seconds (ref 11.6–15.2)
Prothrombin Time: 15.2 seconds (ref 11.6–15.2)

## 2010-11-26 LAB — BODY FLUID CELL COUNT WITH DIFFERENTIAL
Eos, Fluid: 0 %
Monocyte-Macrophage-Serous Fluid: 48 % — ABNORMAL LOW (ref 50–90)
Other Cells, Fluid: 0 %

## 2010-11-26 LAB — LACTATE DEHYDROGENASE, PLEURAL OR PERITONEAL FLUID: LD, Fluid: 75 U/L — ABNORMAL HIGH (ref 3–23)

## 2010-11-26 LAB — BODY FLUID CULTURE
Culture: NO GROWTH
Gram Stain: NONE SEEN

## 2010-11-26 LAB — COMPREHENSIVE METABOLIC PANEL
AST: 31 U/L (ref 0–37)
Albumin: 3.1 g/dL — ABNORMAL LOW (ref 3.5–5.2)
Calcium: 9.3 mg/dL (ref 8.4–10.5)
Creatinine, Ser: 1.59 mg/dL — ABNORMAL HIGH (ref 0.4–1.2)
GFR calc Af Amer: 36 mL/min — ABNORMAL LOW (ref >60)
GFR calc non Af Amer: 30 mL/min — ABNORMAL LOW (ref >60)

## 2010-11-26 LAB — DIFFERENTIAL
Basophils Absolute: 0 10*3/uL (ref 0.0–0.1)
Basophils Absolute: 0 10*3/uL (ref 0.0–0.1)
Basophils Relative: 0 % (ref 0–1)
Basophils Relative: 0 % (ref 0–1)
Eosinophils Relative: 0 % (ref 0–5)
Lymphocytes Relative: 4 % — ABNORMAL LOW (ref 12–46)
Lymphs Abs: 0.4 10*3/uL — ABNORMAL LOW (ref 0.7–4.0)
Monocytes Absolute: 0.8 10*3/uL (ref 0.1–1.0)
Monocytes Relative: 9 % (ref 3–12)
Neutro Abs: 7.4 10*3/uL (ref 1.7–7.7)
Neutro Abs: 9.5 10*3/uL — ABNORMAL HIGH (ref 1.7–7.7)
Neutrophils Relative %: 83 % — ABNORMAL HIGH (ref 43–77)

## 2010-11-26 LAB — PHOSPHORUS: Phosphorus: 3.4 mg/dL (ref 2.3–4.6)

## 2010-11-26 LAB — BRAIN NATRIURETIC PEPTIDE
Pro B Natriuretic peptide (BNP): 526 pg/mL — ABNORMAL HIGH (ref 0.0–100.0)
Pro B Natriuretic peptide (BNP): 979 pg/mL — ABNORMAL HIGH (ref 0.0–100.0)

## 2010-11-26 LAB — CARDIAC PANEL(CRET KIN+CKTOT+MB+TROPI)
CK, MB: 2.1 ng/mL (ref 0.3–4.0)
Relative Index: INVALID (ref 0.0–2.5)
Relative Index: INVALID (ref 0.0–2.5)
Total CK: 46 U/L (ref 7–177)
Total CK: 56 U/L (ref 7–177)
Troponin I: 0.03 ng/mL (ref 0.00–0.06)
Troponin I: 0.03 ng/mL (ref 0.00–0.06)

## 2010-11-26 LAB — URINALYSIS, ROUTINE W REFLEX MICROSCOPIC
Ketones, ur: NEGATIVE mg/dL
Protein, ur: 30 mg/dL — AB
Urobilinogen, UA: 0.2 mg/dL (ref 0.0–1.0)

## 2010-11-26 LAB — MAGNESIUM
Magnesium: 1.8 mg/dL (ref 1.5–2.5)
Magnesium: 2.2 mg/dL (ref 1.5–2.5)

## 2010-11-26 LAB — CK TOTAL AND CKMB (NOT AT ARMC)
Relative Index: INVALID (ref 0.0–2.5)
Total CK: 68 U/L (ref 7–177)

## 2010-11-26 LAB — LIPID PANEL
HDL: 60 mg/dL (ref >39)
LDL Cholesterol: 85 mg/dL (ref 0–99)
Total CHOL/HDL Ratio: 2.6 RATIO
Triglycerides: 64 mg/dL (ref <150)
VLDL: 13 mg/dL (ref 0–40)

## 2010-11-26 LAB — URINE MICROSCOPIC-ADD ON

## 2010-11-26 LAB — PROTEIN S, TOTAL: Protein S Ag, Total: 105 % (ref 70–140)

## 2010-11-26 LAB — PATHOLOGIST SMEAR REVIEW

## 2010-11-26 LAB — HEPARIN LEVEL (UNFRACTIONATED): Heparin Unfractionated: 0.13 IU/mL — ABNORMAL LOW (ref 0.30–0.70)

## 2010-12-25 ENCOUNTER — Other Ambulatory Visit: Payer: Self-pay | Admitting: Internal Medicine

## 2011-01-25 NOTE — H&P (Signed)
NAME:  Debbie Spears, Debbie Spears NO.:  [id number]   MEDICAL RECORD NO.:  [id number]          PATIENT TYPE:  INP   LOCATION:  5028                         FACILITY:  [facility]   PHYSICIAN:  Veverly Fells. Altheimer, M.D.DATE OF BIRTH:  Jan 09, 1912   DATE OF ADMISSION:  02/07/2007  DATE OF DISCHARGE:                              HISTORY & PHYSICAL   REASON FOR ADMISSION:  Diarrhea for 11 days with dehydration and  weakness.   This is a 77 year old remarkably healthy woman with really no  significant medical problems other than resistant hypertension who  developed diarrhea about 11 days ago.  She tried Imodium and Pedialyte.  Her daughter called the office on Jan 30, 2007 and was advised to add  Lomotil.  She was then seen in the office on Feb 01, 2007 with no  improvement.  She has continued to have 3 or 4 watery stools per day,  mainly after she tried to eat.  She denied abdominal pain but did have  some rolling sensations in her abdomen.  There is no hematochezia or  melena.  There was no fever or chills.  Blood pressure was 94/54.  Abdominal exam was normal.  Welchol was added empirically, and she was  advised to hold Lotensin and Norvasc and to continue the Lomotil.  She  returned to the office yesterday, Feb 06, 2007, again with her daughter,  Aurea Graff, complaining of ongoing diarrhea.  The pattern was about the same  with 3 or 4 loose to watery stools per day, mainly just after she tried  to eat.  Fluid intake was fair, and her mouth is dry.  Her weight  was  down by her history about 10 pounds in about 10 days.  She was somewhat  weak.  A couple doses of Pepto-Bismol had not helped.  On exam in the  office yesterday, she was afebrile with blood pressure 114/56,  ambulatory but somewhat weak.  WBC was 6.0.  CRP was 11.  BUN 33,  creatinine 1.8.  She was able to tolerate some oral fluids in the  office.  Stool was obtained for culture, ova and parasites and Giardia  EIA.  She was  empirically started on Cipro 250 mg b.i.d. and Flora-Q  b.i.d.  Arrangements were made for a home health visit for IV fluid, as  she wanted to avoid having to come to the hospital.   Unfortunately, the home care visit got deferred until at least this  evening, and as of this morning the patient was weaker and more  dehydrated, and her daughter brought her to the emergency room.  She now  feels significantly better having received IV saline.  She had about 3  more loose stools this morning but none in the last 7 hours.   PAST MEDICAL HISTORY:  1. Hypertension, resistant, requiring 5 medications with blood      pressure 140/68 in April.  2. Hypercholesterolemia but with high HDL.  3. Impaired fasting glucose.  4. Osteoporosis by DEXA in 2005.  5. Osteoarthritis left knee.  6. She has not had any surgeries.  DRUG SENSITIVITIES:  1. TENEX resulted in dry mouth and constipation.  2. COZAAR resulted in insomnia.   MEDICATIONS:  1. As above for the diarrhea.  2. Cipro as above which was started yesterday.  3. Lotensin 40 mg daily has been held for the last few days.  4. Norvasc 10 mg daily has been held for the last few days.  5. Tenoretic 100 mg daily.  6. Benicar 40 mg daily as well.  7. K-Dur 20 mEq daily.  8. Calcium.  9. Multivitamin.   FAMILY HISTORY:  Mother died at age 5 of stroke.  Brother died at age  60 of MI.  She has one child.   SOCIAL HISTORY:  She is a widow.  She lives alone.  Her daughter and  others call or check on her daily.  She does not smoke or drink alcohol.   REVIEW OF SYSTEMS:  She denies dyspnea or chest pain or palpitations.  GASTROINTESTINAL:  As above.  She had a little bit of nausea at times  yesterday.  GENITOURINARY:  No complaints.  MUSCULOSKELETAL:  No  complaints.  NEUROLOGIC:  No complaints.   PHYSICAL EXAMINATION:  GENERAL:  Alert, pleasant, cooperative 49-year-  old white female resting comfortably in no acute distress.  VITAL SIGNS:   She is afebrile.  Blood pressure was 80/35 this morning in  the emergency room; more recently 137/54.  SKIN:  Normal.  HEENT:  Eyes normal externally.  Oropharynx dry yesterday but improved  today.  NECK:  Supple without thyromegaly with carotid upstrokes normal without  bruit.  LUNGS:  Unlabored, clear.  HEART:  Regular without murmur.  ABDOMEN:  Soft, nontender, no mass, active bowel sounds.  EXTREMITIES:  Pedal pulses present without edema.  NEUROLOGIC:  Without focal deficit.   LABORATORY DATA:  WBC 11.1 with 79 neutrophils, hemoglobin about 17.  Sodium 141, potassium 5.2, glucose 104, BUN 39, creatinine to 2.4.   ASSESSMENT:  1. Diarrhea for about 11 days, probably due to viral enteritis,      although this is a rather prolonged course.  2. Dehydration secondary to number one.  3. Renal insufficiency secondary to above.  4. Generalized weakness secondary to above.  5. Hypertension, resistant.  6. Dyslipidemia.  7. Impaired fasting glucose.  8. Osteoporosis.  9. Osteoarthritis.   PLAN:  Will continue with IV saline at a reduced rate of 80 per hour.  Will continue the Cipro empirically which was started yesterday pending  results of stool studies which are pending on an outpatient basis.  Will  empirically continue Flora-Q and Lomotil and Welchol.  Will hold her  blood pressure medications for now other than the Tenormin.  Consider GI  consult if her symptoms are not resolving.      Veverly Fells. Altheimer, M.D.  Electronically Signed     MDA/MEDQ  D:  02/07/2007  T:  02/08/2007  Job:  [id number]

## 2011-01-25 NOTE — Consult Note (Signed)
NAME:  Debbie Spears, Debbie Spears NO.:  [id number]   MEDICAL RECORD NO.:  [id number]          PATIENT TYPE:  INP   LOCATION:  5028                         FACILITY:  [facility]   PHYSICIAN:  Shirley Friar, MDDATE OF BIRTH:  Apr 22, 1912   DATE OF CONSULTATION:  DATE OF DISCHARGE:                                 CONSULTATION   REASON FOR CONSULTATION:  Diarrhea.   HISTORY OF PRESENT ILLNESS:  Ms. Mickle is a pleasant 77 year old  white female seen in consultation at the request of Dr. Leslie Dales, in  regards to her diarrhea.  She was reportedly in a normal state of  health, until 13 days ago, when she developed acute onset of  postprandial watery diarrhea.  Around that time, she says she started having two or three episodes of  watery stool within 15 minutes of eating, and this pattern has been  persistent, since the onset 13 days ago.  She tried PediaLyte and  Imodium at home and was advised to start Lomotil, without any benefit.  As an outpatient, she was empirically started on Cipro and Flora-Q, and  due to continued diarrhea and onset of dehydration, she was admitted.  Stool studies from Dr. Altheimer's office are pending at this time.  On  presentation, she was dehydrated, having renal insufficiency thought to  be secondary to her profuse diarrhea.   PAST MEDICAL HISTORY:  Hypertension, hypercholesterolemia, impaired  fasting glucose, osteoporosis, osteoarthritis.   MEDICATIONS:  On admission:  Cipro, Lotensin, Norvasc, Tenoretic,  Benicar, K-Dur, calcium, multivitamin.   REVIEW OF SYSTEMS:  Negative from a GI standpoint including no nausea,  vomiting or abdominal pain (reports some abdominal discomfort around the  time of her diarrhea, otherwise no pain).   PHYSICAL EXAM:  VITAL SIGNS:  Temperature 97, pulse 68, blood pressure  104/58, O2 SATs 95% on room air.  GENERAL:  Alert, no acute distress.  ABDOMEN:  Soft, nontender, nondistended.  Active bowel  sounds.   LABS:  White blood count 9.3, hemoglobin 12.7, platelet count 158, BUN  21, creatinine 1.26, down from admission; BUN of 39, creatinine 2.4.  Stool studies pending.   IMPRESSION:  A 77 year old white female with 13 days of abdominal 13  days of postprandial watery diarrhea.  Agree with checking stool studies  to look for fecal white blood cells, ova and parasites, and Clostridium  difficile toxin.  Possible trigger could have been food poisoning. She  does report eating sausage around the time this started, which she says  tasted funny.  I doubt this is an ischemic process.  The main concern  would be for infectious process such as Clostridium difficile colitis or  gastroenteritis.  I would not start Flagyl unless stool studies are  warranted.  Will continue supportive care and okay to continue Cipro and  Flora-Q at this time.  If stool studies are  negative and she continues to have diarrhea, then may need to do a  flexible sigmoidoscopy to look for pseudomembranous colitis.  Thank you  for this consultation and allowing [place] to participate in her care.  Dr.  Doreatha Martin  Evette Cristal will be available to see Ms. Argyle this weekend.      Shirley Friar, MD  Electronically Signed     VCS/MEDQ  D:  02/09/2007  T:  02/09/2007  Job:  [id number]   cc:   Veverly Fells. Altheimer, M.D.

## 2011-01-25 NOTE — H&P (Signed)
NAME:  Debbie Spears, FOULK NO.:  [id number]   MEDICAL RECORD NO.:  [id number]          PATIENT TYPE:  INP   LOCATION:  3031                         FACILITY:  [facility]   PHYSICIAN:  Alfonse Alpers. Gegick, M.D.DATE OF BIRTH:  12-Dec-1911   DATE OF ADMISSION:  03/24/2007  DATE OF DISCHARGE:                              HISTORY & PHYSICAL   HISTORY:  This is a 77 year old woman who presents with a history of  persistent vomiting.  The patient has been in good health previously.  In May of 2008, she began having symptoms of diarrhea.  Associated with  this was some nausea.  This was persistent.  She eventually had several  hospitalizations with episodes of diarrhea.  She was found to have  abnormal gallbladder with cholelithiasis and had a cholecystectomy.  Her  diarrhea persisted after this.  It was not worsened nor improved by the  cholecystectomy, according to the daughter.  She most recently has been  treated for a urinary tract infection with Cipro, which was stopped  approximately one week ago.  She continues to have episodes of diarrhea  and decreased food intake.  She has discomfort in her abdomen and has  nonspecific symptoms.  The patient has been losing weight, probably  approximately 7-8 pounds, maybe 10 pounds, over the last several months.  Clostridium difficile has been negative continuously.  She has had  endoscopy and colonoscopy without showing any etiology.   PAST MEDICAL HISTORY:  Her past medical history is essentially negative.  She apparently has no other hospitalizations.   MEDICATIONS PRIOR TO THIS ADMISSION:  None, except for recent Benicar.  She had previously also been treated with Questran, but this was not  effective, according to the daughter.   REVIEW OF SYSTEMS:  Her weight has decreased.  There is some discomfort  in her abdomen that is associated with nausea.  Vomiting is also  present.  No episodes of chest pain.   PHYSICAL EXAMINATION:   GENERAL:  Physical examination reveals a well-  developed, thin woman who appears chronically ill.  HEENT:  Her head is normocephalic.  Examination of the mouth reveals  findings consistent with oral thrush.  NECK:  Her neck is supple.  A finely nodular mass is palpable in the  anterior trachea.  It is palpated upon swallowing.  LUNGS:  Clear.  CARDIOVASCULAR:  Rhythm is regular.  The rate is rather rapid, at 90.  BREASTS:  Exam shows no masses to be present.  ABDOMEN:  Soft, no masses or tenderness are present.  EXTREMITIES:  Normal.  NEUROMUSCULAR:  Grossly intact.   IMPRESSION ON ADMISSION:  1. Diarrhea, metabolic acidemia, etiology undetermined.  2. Oral thrush.  3. Tinea cruris.  4. History of hypertension.           ______________________________  Alfonse Alpers Dagoberto Ligas, M.D.     CGG/MEDQ  D:  03/24/2007  T:  03/24/2007  Job:  [id number]

## 2011-01-25 NOTE — Op Note (Signed)
NAME:  WYLODEAN, SHIMMEL NO.:  [id number]   MEDICAL RECORD NO.:  [id number]          PATIENT TYPE:  INP   LOCATION:  5152                         FACILITY:  [facility]   PHYSICIAN:  Adolph Pollack, M.D.DATE OF BIRTH:  1912-09-12   DATE OF PROCEDURE:  03/02/2007  DATE OF DISCHARGE:                               OPERATIVE REPORT   PREOPERATIVE DIAGNOSIS:  Symptomatic cholelithiasis.   POSTOPERATIVE DIAGNOSIS:  Chronic cholecystitis and cholelithiasis.   PROCEDURE:  Laparoscopic cholecystectomy with intraoperative  cholangiogram.   SURGEON:  Adolph Pollack, M.D.   ASSISTANT:  Anselm Pancoast. Zachery Dakins, M.D.   ANESTHESIA:  General.   INDICATIONS:  This is a 77 year old female with a month history of  diarrhea and has been in the hospital and developed some epigastric and  right upper quadrant pain with nausea which became the predominant  symptoms.  Ultrasound demonstrated gallstones.  It was felt some of the  symptoms may be secondary to her gallbladder and I was asked to see her.  I had a long discussion with her about this and she has elected to have  cholecystectomy.   TECHNIQUE:  She is seen in the holding area, brought to the operating  room, placed on the operating table, and a general anesthetic was  administered.  The abdominal wall was sterilely prepped and draped.  Marcaine solution was infiltrated in the subumbilical region.  A  subumbilical incision was made through the skin and subcutaneous tissue,  fascia and peritoneum entering the peritoneal cavity under direct  vision.  A pursestring suture of 0 Vicryl was placed around the fascial  edges.  A Hassan trocar was introduced into the peritoneal cavity and  pneumoperitoneum was created by insufflation of CO2 gas.   Following this, a laparoscope was introduced.  The gallbladder appeared  to be pale white consistent with chronic inflammatory change.  No acute  inflammatory change was noted.  An 11 mm  trocar was placed in the  epigastric and two 5 mm trocars placed in the right upper quadrant.   She was placed in the reverse Trendelenburg position with the right side  tilted slightly up.  The gallbladder was grasped and was very firm.  The  fundus was grasped and retracted to the right shoulder.  Adhesions  between the omentum and body and infundibulum of the gallbladder were  taken down carefully bluntly then sharply.  I then was able to grasp the  infundibulum and mobilize it.  The cystic duct was identified and a  window created around it.  An anterior branch of the cystic artery was  identified and a window created around it, as well.  I then placed a  clip above the cystic duct gallbladder junction and made an incision at  the cystic duct gallbladder junction and milked some bile back.  A  cholangiocatheter was then passed through the anterior abdominal wall  into the cystic duct and cholangiogram was performed.   Under real time fluoroscopy, dilute contrast was injected into the  cystic duct.  The common hepatic, right and left hepatic, and common  bile ducts all filled promptly and contrast drained promptly into the  duodenum without obvious evidence of obstruction.  There was a little  bit of leakage around the catheter.   The cholangiocatheter was removed, the cystic duct was then clipped four  times proximally and divided.  The anterior branch of the cystic artery  was clipped and divided.  The posterior branch of the cystic artery was  identified, a window created around it, and it was clipped and divided.  The gallbladder was then dissected free from liver intact and placed in  an Endopouch bag.  The gallbladder fossa was then copiously irrigated  and examined.  Hemostasis was adequate.  No bile leak was noted.   The irrigation fluid was evacuated as much as possible.  The gallbladder  was removed through the subumbilical port after crushing some of the  stones which  were quite large, measuring at least 1.5 to 2 cm.  I then  closed the subumbilical fascial defect by tightening up and tying down  the pursestring suture and adding a figure-of-eight 0 Vicryl suture  under laparoscopic vision.  The remaining trocars were removed and the  pneumoperitoneum is released.  The skin incisions were closed with 4-0  Monocryl subcuticular stitches followed by Steri-Strips and sterile  dressings.  She tolerated the procedure without any apparent  complications and was taken to recovery in satisfactory condition.      Adolph Pollack, M.D.  Electronically Signed     TJR/MEDQ  D:  03/02/2007  T:  03/02/2007  Job:  [id number]   cc:   Veverly Fells. Altheimer, M.D.

## 2011-01-25 NOTE — Consult Note (Signed)
NAME:  [name], [name] NO.:  [id number]   MEDICAL RECORD NO.:  [id number]          PATIENT TYPE:  INP   LOCATION:  5152                         FACILITY:  [facility]   PHYSICIAN:  Adolph Pollack, M.D.DATE OF BIRTH:  Jan 12, 1912   DATE OF CONSULTATION:  03/01/2007  DATE OF DISCHARGE:                                 CONSULTATION   REFERRING PHYSICIAN:  Veverly Fells. Altheimer, M.D.   REASON:  Epigastric pain; gallstones.   HISTORY OF PRESENT ILLNESS:  Debbie Spears is a 77 year old female who  for the past month has been suffering with diarrhea complicated by  dehydration, sometimes has required hospital admission.  The etiology  has been unclear.  She was eventually able to be discharged from the  hospital on February 15, 2007 and completed a course of Flagyl.  It was felt  this was C. difficile colitis.  However, she was readmitted February 21, 2007 with profuse diarrhea, nausea and vomiting.  When she arrived in  the emergency department, she was hypotensive and had to have aggressive  volume resuscitation.  He has had a sigmoidoscopy at that time which did  not demonstrate any colitis or pseudomembranous.  Three days ago she  developed some epigastric pain postprandially with nausea.  Upper  endoscopy shows mild gastritis.  She has been placed on proton pump  inhibitors and antacids without relief of symptoms.  Abdominal  ultrasound demonstrated multiple gallstones, no pericholecystic fluid,  no gallbladder wall thickening.  However, she continues to have the  postprandial epigastric pain, sometimes radiating around to the back and  associated with nausea.  Because of the finding of gallstones, I was  asked to see her regarding possible cholecystectomy.   PAST MEDICAL HISTORY:  1. Hypertension.  2. Hypercholesterolemia.  3. Osteoporosis.  4. Osteoarthritis.  5. Diarrhea.  6. Dehydration.   PREVIOUS OPERATIONS:  None.   CURRENT MEDICATIONS:  1. Nystatin.  2.  Flora-Q.  3. Potassium.  4. Zofran.  5. Norvasc.  6. Protonix.  7. Nutritional supplements.   SOCIAL HISTORY:  She lives alone.  She talks with her daughter daily.  No tobacco or alcohol use.   REVIEW OF SYSTEMS:  CARDIOVASCULAR:  No known heart disease.  PULMONARY:  No shortness of breath, pneumonia or COPD.  GI:  No known hepatitis,  diverticulitis or peptic ulcer disease.  GU:  No kidney stones.  ENDOCRINE:  No diabetes.  NEUROLOGIC:  No strokes or seizures.  HEMATOLOGIC:  No bleeding disorders or blood clots.  MUSCULOSKELETAL:  Has noted some lower extremity edema.   PHYSICAL EXAM:  GENERAL:  A pale, thin female in no acute distress.  She  is very pleasant and cooperative.  VITAL SIGNS:  Temperature is 98.3, blood pressure is 138/69, pulse 69,  O2 saturations is 95% room air.  EYES:  Extraocular motions intact.  No icterus.  NECK:  Shows some muscular wasting, no obvious mass.  RESPIRATORY:  Breath sounds are equal and clear.  Respirations are  unlabored.  CARDIOVASCULAR:  Demonstrates a regular rate and regular rhythm.  There  is some lower  extremity pedal edema noted.  ABDOMEN:  Soft with mild  epigastric tenderness.  No masses, no scars.  Normal bowel sounds heard.  No organomegaly.  MUSCULOSKELETAL:  Muscular wasting is noted in the  upper extremities.  SKIN:  Pale with no jaundice.  NEUROLOGIC:  She is alert and oriented and answers questions  appropriately.   LABORATORY DATA:  Notable for normal liver function tests except albumin  1.6.  Hemoglobin is 11.2 and white blood cell count 7000.   Ultrasound report reviewed.   IMPRESSION:  Prolonged diarrheal illness, now with epigastric discomfort  and nausea along with anorexia being her primary symptoms and the  symptoms persist despite antacids.  I have been asked to see her  regarding a possible atypical presentation of biliary colic.  I agree  this could be an atypical presentation, but possibly could not.  She   does have severe malnutrition.   PLAN:  I discussed performing a laparoscopic cholecystectomy with her.  I went over the procedure and risks including failure to relieve her  symptoms.  Risks include, but are not limited to, bleeding, infection,  wound-healing problems, anesthesia, accidental damage to the common bile  duct/liver/intestines, and worsening diarrhea.  I also told her that  given her malnutrition and her age, her risks may be slightly increased;  she seems to understand this.  After thinking about it, she said she  would like to proceed with cholecystectomy, so we will to get that set  up.      Adolph Pollack, M.D.  Electronically Signed     TJR/MEDQ  D:  03/01/2007  T:  03/02/2007  Job:  [id number]   cc:   Veverly Fells. Altheimer, M.D.  Graylin Shiver, M.D.

## 2011-01-25 NOTE — Discharge Summary (Signed)
NAME:  Debbie Spears, Debbie Spears NO.:  [id number]   MEDICAL RECORD NO.:  [id number]          PATIENT TYPE:  INP   LOCATION:  6739                         FACILITY:  [facility]   PHYSICIAN:  Veverly Fells. Altheimer, M.D.DATE OF BIRTH:  1912-08-02   DATE OF ADMISSION:  03/06/2007  DATE OF DISCHARGE:  03/18/2007                               DISCHARGE SUMMARY   REASON FOR ADMISSION:  This was the third recent admission for this 77-  year-old woman with diarrhea, dehydration and hypotension.   HISTORY:  She was hospitalized May 28 through June 5 and again June 11  through June 23 with history of ongoing diarrhea since Jan 21, 2007.  Details are provided in the admission note of March 06, 2007.  She had  extensive evaluation and multiple trials of empiric therapies as  detailed in that admission note.  Ultimately, she underwent laparoscopic  cholecystectomy with intraoperative cholangiogram on June 20 with  findings of three very large gallstones and evidence of chronic  gallbladder inflammation.  By postop day one during that hospitalization  she reported that she felt better than she had in a month, with no  further nausea, dyspepsia or diarrhea and much improved appetite.   After being discharged home on March 05, 2007, she was doing fairly well  until June 24 when she had sudden onset of loose watery diarrhea and  nausea, and a syncopal episode while sitting on the commode after which  she was unresponsive for two to three minutes.  She called 911.  In the  emergency room on March 06, 2007 she was pale, weak with blood pressure  systolic as low as 80 and remaining in the 80s despite one liter of  saline.  She was complaining of generalized weakness, malaise, dry mouth  and continued nausea and episgastric discomfort.   She had lost weight from her baseline of about 110 pounds down to about  100 pounds.   PAST MEDICAL HISTORY:  1. Hypertension, resistant, previously requiring five  drugs.  2. Hypercholesterolemia.  3. Impaired fasting glucose.  4. Osteoporosis by DEXA 2005.  5. Osteoarthritis left knee.  6. She had never had any surgeries prior to the laparoscopic      cholecystectomy March 02, 2007.  7. She had never had any other hospitalizations other than childbirth      and the two recent hospitalizations for the current illness      described above.   DRUG SENSITIVITIES:  TANNEX RESULTED IN DRY MOUTH AND CONSTIPATION.  COZAAR RESULTED IN INSOMNIA.   Medications most recently at home were:  1. Tenoretic 100 mg daily.  2. Benicar 40 mg daily.  3. K-Dur 20 mEq daily.  4. Tylenol 1000 mg q.6 hours p.r.n. incisional pain.  5. Flora-Q.  6. Imodium p.r.n.  7. Nystatin cream b.i.d. as needed for perineal candidiasis.   We had been holding her Lotensin and Norvasc.   FAMILY HISTORY:  See prior reports.   SOCIAL HISTORY:  Widow who lives alone.  Daughter, Aurea Graff, checks on her  daily.   Review of systems on admission was  otherwise essentially negative.   PHYSICAL EXAMINATION:  GENERAL:  On admission, pale, very weak,  cachectic, 77 year old white female appropriately responsive.  VITAL SIGNS:  Temperature 98.9, BP as above, heart rate 72 to 104, O2  sat 98%.  HEENT:  Mouth was dry.  LUNGS:  Unlabored and clear.  HEART:  Regular with ectopics.  ABDOMEN:  Soft, nontender other than at incision sites of recent  laparoscopic cholecystectomy which were Steri-Stripped and healing well.  Abdomen was nondistended with quiet bowel sounds.  EXTREMITIES:  Trace ankle edema.  NEUROLOGIC:  Without focal deficit.   Lab on admission was notable for sodium 140, potassium 4.1, BUN 6,  creatinine 1.12, WBC 14.4 with 94% neutrophils, hemoglobin 15.1.   HOSPITAL COURSE:  She was admitted for this chronic diarrhea for six  weeks with etiology remaining unclear despite extensive diagnostic  evaluations and despite laparoscopic cholecystectomy.  She was continued  on  rehydration, Zofran and general support.  TNA was started for  nutritional support.  Blood pressure medications were initially all  held.  Stool studies were repeated.  She was seen in GI consultation on  June 25 by Charlott Rakes, MD who agreed with the TNA, low-fat clear  liquid diet and trying Ensure or Resource supplements.  It was  recommended that she be continued on Flora-Q.  Cipro was started for  asymptomatic urinary tract infection.  She was also seen in surgical  consultation by Avel Peace, MD on March 07, 2007 which was  postoperative day 5.  He noted that incisions were healing well.  He  noted that she had done well for three days after her cholecystectomy  prior to recurrence of the diarrhea.  He recommended that when she is  able to eat again, she be on a strict, bland low-fat diet.   By March 07, 2007, she was feeling much better, but still quite weak.  She had no appetite and her mouth was still dry.  WBC was down to 6.6  and  hemoglobin down to 11.1 with rehydration.  Albumin was 1.5.  IV was  reduced to 100 an hour and TNA was started via PICC line.  She underwent  colonoscopy on March 08, 2007 which was normal.  Random biopsies were  negative for microscopic colitis.  Magnesium was 0.7 and she received  several courses of IV magnesium ranging from 2 to 4 grams over the next  several days with ultimate stabilization of her magnesium at about 1.5  which is normal.  By June 26, her appetite was slightly better and she  was tolerating clear liquids.  Thyroid labs, B12 and folatewere normal.  25-vitamin D level was normal at 40.  C. diff  toxin was negative.  Chest x-ray showed PICC line to be in place, left pleural effusion and  left lower lobe atelectasis versus infiltrate which was asymptomatic.  IV was reduced to 50 ml/hour.  On June 27, she again had an episode of a  large volume, loose watery stool preceded by nausea.  As usual, this  episode was postprandial,  but she had only had a few bites of breakfast.  She was started on Aciphex empirically.   She was seen by physical therapy because of deconditioning and walked as  able.   She subsequently averaged one or two soft to loose stools per day over  the next several days.  She had no further upper abdominal symptoms.  Resource supplements were avoided since they seemed to aggravate  the  diarrhea tendency.  Repeat urinalysis was negative from June 29.  She  was also treated with cholestyramine and Imodium was added on a b.i.d.  scheduled basis.  She gradually resumed her diet, monitoring for foods  that may trigger the diarrhea.  Blood pressure control remained good on  just ARB therapy.  By March 15, 2007, she reported that food was starting  to taste good again.  Prealbumin was 12.4.   By July 3 through the day of discharge, she was having just one to two  semiformed to loose stools daily which were becoming more formed.  By  the day of discharge, she was feeling much better with improved appetite  and strength and she reported one regular bowel movement on the day  prior to discharge.   BMP and magnesium levels are pending from the day of discharge.   CONDITION ON DISCHARGE:  Much improved.   FINAL DIAGNOSES:  1. Diarrhea, chronic over the past seven weeks, etiology unclear      despite extensive diagnostic evaluation, hopefully now resolving.  2. Nausea and epigastric distress, mostly resolved since laparoscopic      cholecystectomy March 02, 2007, no significant recurrence since March 06, 2007.  3. Leukocytosis, resolved.  4. Anemia.  5. Hypomagnesemia, resolved with level on day of discharge pending.  6. Weight loss with severe protein calorie malnutrition.  7. Dehydration with acute recurrence on the day of admission      associated with hypotension, resolved.  8. Generalized weakness secondary to above, improving.  9. Perineal candidiasis, improving.  10.Prior history of  resistant hypertension requiring five drugs, but      since she is clearly prone toward hypotension with any degree of      acute volume depletion, currently being less aggressive with her      blood pressure management.  11.Urinary tract infection, resolved.  12.Deconditioning, improving.   DISCHARGE PLAN:  1. She is to gradually add back foods as tolerated.  2. She is to gradually increase activity as tolerated.   MEDICATIONS:  1. Benicar 40 mg daily.  2. Flora-Q daily.  3. Welchol 625 mg b.i.d. p.r.n. or cholestyramine 1 scoop bid p.r.n.  4. Imodium A-D b.i.d. p.r.n.  5. Nystatin cream b.i.d. p.r.n. for perineal rash.   She is to stay off of her other blood pressure medications and to stay  off her potassium for now.   Follow up with Veverly Fells. Altheimer, MD in three weeks.  Follow up with  GI p.r.n.  Follow up with Avel Peace, MD (general surgery) p.r.n.  or as otherwise directed.  She and daughter Aurea Graff knows to call me if she  has any questions or problems.      Veverly Fells. Altheimer, M.D.  Electronically Signed     MDA/MEDQ  D:  03/18/2007  T:  03/18/2007  Job:  [id number]   cc:   Shirley Friar, MD  Adolph Pollack, M.D.

## 2011-01-25 NOTE — Op Note (Signed)
NAME:  Debbie Spears, Debbie Spears NO.:  [id number]   MEDICAL RECORD NO.:  [id number]          PATIENT TYPE:  INP   LOCATION:  4734                         FACILITY:  [facility]   PHYSICIAN:  Bernette Redbird, M.D.   DATE OF BIRTH:  1912/07/23   DATE OF PROCEDURE:  02/23/2007  DATE OF DISCHARGE:                               OPERATIVE REPORT   PROCEDURE:  Flexible sigmoidoscopy with biopsies.   INDICATIONS:  A 77 year old female with severe watery diarrhea, C.  difficile negative, that seemed to respond to Flagyl previously but when  it was stopped, the severe diarrhea resumed shortly thereafter.  She was  readmitted a couple of days ago for that reason.   FINDINGS:  Normal exam to 50 cm.   PROCEDURE:  The nature, purpose and risks of the procedure had been  discussed with the patient, who provided written consent.  She was  brought from her hospital room to the endoscopy unit.  The procedure was  done unprepped.  The procedure was done without IV sedation.   The Pentax pediatric video colonoscope was advanced to 50 cm without  difficulty and pullback was then performed.  I believe I was in the  midportion of the descending colon.   There was a moderate amount of liquid watery brown stool in the colonic  lumen, and this was suctioned up and a sample was admitted to check for  C. difficile again.   This was otherwise a normal exam.  I did not see any colitis or  pseudomembranes or other source of diarrhea, nor did I see polyps,  cancer, vascular ectasia or diverticulosis.   Random mucosal biopsies were obtained along the length of the examined  colon.  The scope was then removed from the patient.  She tolerated the  procedure well and there were no apparent complications.   IMPRESSION:  1. No source of diarrhea endoscopically evident.  2. Large amount of watery brown stool present, consistent with history      of diarrhea.  3. Severe perianal candidiasis noted at time of  this exam.   PLAN:  1. Await results of random biopsies and C. difficile stool specimen.  2. For now, I would continue vancomycin and Flora-Q.  3. I will attempt to get more accurate stool counts so that we can      tell whether or not we are making progress with the patient's      condition.  4. We will continue cream to the perianal area.           ______________________________  Bernette Redbird, M.D.     RB/MEDQ  D:  02/23/2007  T:  02/24/2007  Job:  [id number]   cc:   Veverly Fells. Altheimer, M.D.

## 2011-01-25 NOTE — Op Note (Signed)
NAME:  Debbie Spears, Debbie Spears NO.:  [id number]   MEDICAL RECORD NO.:  [id number]          PATIENT TYPE:  INP   LOCATION:  6739                         FACILITY:  [facility]   PHYSICIAN:  Shirley Friar, MDDATE OF BIRTH:  09/26/1911   DATE OF PROCEDURE:  03/08/2007  DATE OF DISCHARGE:                               OPERATIVE REPORT   PROCEDURE:  Colonoscopy.   INDICATION:  Diarrhea.   MEDICATIONS:  Fentanyl 35 mcg IV, Versed 3.5 mg IV, 1 gram of magnesium  sulfate.   FINDINGS:  Rectal exam was normal.  A pediatric Pentax colonoscope was  inserted into a adequately prepped colon and advanced to the cecum where  the ileocecal valve and appendiceal orifice were identified.  On careful  withdrawal of the colonoscope revealed no mucosal abnormalities.  The  mucosa was normal in appearance with good vascular supply.  Random  biopsies were taken of the right and left side of the colon.  Retroflexion was unremarkable.   ASSESSMENT:  1. Normal colonoscopy.  2. Status post random biopsies to evaluate for microscopic colitis.   PLAN:  1. Replace electrolytes  2. Follow-up on path.      Shirley Friar, MD  Electronically Signed     VCS/MEDQ  D:  03/08/2007  T:  03/08/2007  Job:  [id number]   cc:   Veverly Fells. Altheimer, M.D.

## 2011-01-25 NOTE — Discharge Summary (Signed)
NAME:  Debbie Spears, Debbie Spears NO.:  [id number]   MEDICAL RECORD NO.:  [id number]          PATIENT TYPE:  INP   LOCATION:  3031                         FACILITY:  [facility]   PHYSICIAN:  Alfonse Alpers. Gegick, M.D.DATE OF BIRTH:  Jan 13, 1912   DATE OF ADMISSION:  03/24/2007  DATE OF DISCHARGE:  03/26/2007                               DISCHARGE SUMMARY   HISTORY OF PRESENT ILLNESS:  This is a 77 year old woman who presents  with a history of not eating, dehydration and diarrhea.  The patient has  had a history of this problem for the last several months.  This has  been associated with vomiting.  Prior to this, she had been in good  health.  She has had significant diarrhea and it was considered to be C.  difficile.  She was treated without improvement.  She was discharged  from the hospital recently, but yet her symptoms returned.  She was not  eating, but still having diarrhea.  Recently, she had been treated for a  urinary tract infection with Cipro.  She also has had discomfort in her  abdomen.  She has had previous endoscopy procedures of upper and lower  bowel.   PHYSICAL EXAMINATION:  GENERAL:  A well-developed woman in no distress.  HEENT:  Her tongue showed signs of Candidiasis.  LUNGS:  Clear.  CARDIOVASCULAR:  Rhythm was regular.  ABDOMEN:  Soft.  No specific tenderness was present.  EXTREMITIES:  No pedal edema.   IMPRESSION:  1. Diarrhea with metabolic acidemia, etiology undetermined.  2. Oral thrush.   HOSPITAL COURSE:  She was admitted to the hospital started on IV fluids.  She was not given any of her antihypertensive medications.  Her oral  thrush was treated with Diflucan.  The following day, she had no  symptoms of diarrhea.  In review of this with her daughter, it appears  that the patient, whenever, she takes her Benicar 2 hours to 3 hours  later, begins to have symptoms of nausea and abdominal pain.  Therefore,  it was discontinued.  The following day,  she had no diarrhea.  Her oral  thrush showed improvement and now today, the diarrhea has receded.  She  had one bowel movement yesterday which was normal and formed.  She is  eating now.  No abdominal pain, no nausea and she feels better.  She was  then discharged.  Her tongue showed complete resolution of the yeast  infection.   DISCHARGE DIAGNOSES:  1. Allergic reaction to Benicar.  2. Oral thrush.   DISCHARGE MEDICATIONS:  She will continue taking as needed  cholestyramine, promethazine, Imodium and Tylenol.  She will not be  taking the Benicar.   DIET:  As tolerated.   FOLLOW UP:  She will be seen in the office on Thursday.   CONDITION ON DISCHARGE:  Improved.           ______________________________  Alfonse Alpers Dagoberto Ligas, M.D.     CGG/MEDQ  D:  03/26/2007  T:  03/26/2007  Job:  [id number]

## 2011-01-25 NOTE — H&P (Signed)
NAME:  Debbie Spears, Debbie Spears NO.:  [id number]   MEDICAL RECORD NO.:  [id number]          PATIENT TYPE:  INP   LOCATION:  1844                         FACILITY:  [facility]   PHYSICIAN:  Veverly Fells. Altheimer, M.D.DATE OF BIRTH:  Feb 01, 1912   DATE OF ADMISSION:  03/06/2007  DATE OF DISCHARGE:                              HISTORY & PHYSICAL   REASON FOR ADMISSION:  This is a readmission for this 77 year old with  diarrhea, dehydration and hypotension.   HISTORY OF PRESENT ILLNESS:  This 77 year old woman was hospitalized Feb 07, 2007, through February 15, 2007, and again February 21, 2007 through March 05, 2007 (yesterday), with history of ongoing diarrhea since Jan 21, 2007.  When admitted on May 28, she had an 11-day history of diarrhea which had  not responded to conservative management at home with antidiarrheals  including Lomotil as well as Welchol, FloraQ and Cipro.  She had had  negative stool studies including C. difficile.  Her diarrhea had  subsided during that prior admission when she was treated empirically  with Cipro and then Flagyl.  She had been discharged home on February 15, 2007, and completed a seven day course of Flagyl.  However, she  continued to have no appetite and little food intake.  She was  readmitted February 21, 2007, with profuse diarrhea up to 12 times per hour  which was watery without hematochezia or melena.  She also had nausea  and vomiting for the first time, and was hypotensive when brought by EMS  to the emergency room.  She was admitted then and seen in GI follow up  by Danise Edge, M.D., who suspected high probability of C. difficile  colitis, even though she had had several negative C. difficile toxin  studies as both outpatient and inpatient during that first admission.  She was started on clear liquids and potassium was repleted, and she was  treated with Zofran.  Blood cultures were negative.  Stool cultures were  again all negative.  She was  started at the time of that admission, February 21, 2007, on oral vancomycin empirically.  She seemed to be feeling some  better early on during that hospitalization.  KUB showed nonobstructive  bowel gas pattern.  However, she resumed having 2-3 incontinent liquid  stools per day and began to have some intermittent nausea that was  thought possibly due to the vancomycin.  She underwent flexible  sigmoidoscopy with biopsies by Dr. Bernette Redbird on June 13, which  showed a large amount of watery brown stool but no evident source of  diarrhea was endoscopically evident.  Random biopsies and C. difficile  stool specimen were all negative.  She underwent ultrasound on February 26, 2007, which showed multiple gallstones.  Upper endoscopy on June 17  showed mild erythema of the stomach with biopsy for CLO-test negative.  She was seen by Nutrition for ongoing anorexia and was supplemented with  Resource.  By June 16, she was complaining of increasing nausea and  epigastric discomfort, and she was started on Protonix and Antacid which  did  not seem to help.  Vancomycin was stopped.  She was seen in surgical  consultation on June 19 by Avel Peace, M.D., who agreed that this  may be an atypical presentation of gallbladder disease, potentially  explaining the diarrhea as well as the more recent upper abdominal  symptoms, and he recommended cholecystectomy.  She underwent  laparoscopic cholecystectomy with intraoperative cholangiogram on June  20.  Findings included three very large gallstones and evidence of  chronic gallbladder inflammation.  By postop day #1, she reported that  she felt the best she had felt in a month.  There was no further nausea,  dyspepsia or diarrhea.  Her appetite was much improved.  She was  tolerating her diet with improving strength and was discharged home June  23 (yesterday).   She had a good day at home yesterday and a good night and this morning  had a normal bowel  movement after breakfast.  However, she had sudden  onset of loose, watery diarrhea shortly thereafter, for which she took a  Lomotil.  She then had recurrence of the same type of nausea that she  had experienced recently in the hospital as well as two additional  episodes of watery diarrhea.  While sitting on the commode at about  noon, she had a syncopal episode witnessed by her daughter Aurea Graff who says  that she was then unresponsive for 2-3 minutes.  She called 9-1-1.  In  the emergency room, she has been noted to be pale, weak with blood  pressure systolic about 80, briefly coming up as high as 106 and then  back down into the 80's despite nearly 1 liter of saline.  She is  complaining of generalized weakness, malaise, dry mouth and continued  nausea and epigastric discomfort.  She has not had any further diarrhea  for the past five hours.   She had lost weight from her baseline of about 110 pounds down to about  100 pounds.  Her weight was recorded at 115 pounds two days ago in the  hospital, but I doubt accuracy of that.  I would guess that she is  currently below 100 pounds.   PAST MEDICAL HISTORY:  1. Hypertension, resistant, previously requiring five drug.  2. Hypercholesterolemia.  3. Impaired fasting glucose.  4. Osteoporosis by DEXA 2005.  5. Osteoarthritis, left knee.  6. She had never had any surgeries prior to the laparoscopic      cholecystectomy on March 02, 2007.  7. She had never had any hospitalizations since childbirth other than      the recent two hospitalizations for the current illnesses described      above.   DRUG SENSITIVITIES:  TENEX RESULTED IN DRY MOUTH AND CONSTIPATION.  COZAAR RESULTED IN INSOMNIA.   MEDICATIONS:  She is discharged from the hospital yesterday on  1. Tenoretic 100 mg daily.  2. Benicar 40 mg daily.  3. K-Dur 20 mEq daily.  4. Tylenol 1000 mg q.6 h p.r.n. incisional pain.  5. Nystatin cream b.i.d. as needed for perineal  candidiasis.  6. We have been holding her Lotensin and Norvasc.   FAMILY HISTORY:  See prior reports.   SOCIAL HISTORY:  She is a widow who lives alone.  Her daughter Aurea Graff and  others call or check on her daily.   REVIEW OF SYSTEMS:  She denies chest pain or dyspnea.  She denies any  urinary symptoms.  She denies any musculoskeletal or neurologic  complaints.   PHYSICAL  EXAMINATION:  GENERAL:  Pale, very weak, cachectic 77 year old  white female who responds appropriately to questions and commands.  VITAL SIGNS:  Temperature 98.9, blood pressure as above, heart rate 72-  104, O2 saturation 98% on room air.  SKIN:  Pale but otherwise negative.  HEENT:  Eyes are normal externally.  Mouth is dry.  NECK:  Supple without thyromegaly with carotid upstrokes normal without  bruit.  LUNGS:  Unlabored and clear.  HEART:  Regular with ectopics.  No murmur.  ABDOMEN:  Soft, nontender other than at sites of recent laparoscopic  cholecystectomy, Steri-stripped incisions are healing well.  Abdomen is  also noted to be nondistended with quiet bowel sounds.  EXTREMITIES:  Trace ankle edema.  NEUROLOGICAL:  Without focal deficit.   LABORATORY DATA:  Notable for sodium 140, potassium 4.1, CO2 28, glucose  102, BUN 6, creatinine 1.12.  WBC 14.4 with 94% neutrophils, hemoglobin  15.1.   ASSESSMENT:  1. Diarrhea, chronic over the past 6 weeks, etiology remains unclear      despite extensive diagnostic evaluations as above.  2. Nausea and epigastric distress.  Etiology remains unclear with      recurrence today despite laparoscopic cholecystectomy four days ago      which seemed over the past four days to have resolved all of her GI      symptoms up until today.  3. Leukocytosis with WBC 14.4 today.  4. Weight loss, approaching cachexia.  5. Dehydration with acute recurrence subsequent to recurrent GI      symptoms today.  6. Hypotension, acute recurrence secondary to above.  7. Generalized  weakness secondary to above.  8. Peroneal candidiasis.  9. Prior history of resistant hypertension, but now clearly prone      toward hypotension with any degree of acute volume depletion.  Will      probably need to be less aggressive with her blood pressure      management.   PLAN:  Will continue rehydration. Zofran and Tylenol p.r.n.  Will begin  TNA for nutritional support.  Will hold all of her blood pressure  medications for now and will probably resume no more than 1-2  medications when she goes home this time.  Will continue observation of  her recurrent GI symptoms.  Repeat stool studies.  GI re-consultation  has been discussed with Bernette Redbird, M.D.  Surgical re-consultation  has been discussed with Avel Peace, M.D.  I do not believe that she  has had a CT scan during the prior two hospitalizations, and perhaps  that should be considered at this time.      Veverly Fells. Altheimer, M.D.  Electronically Signed     MDA/MEDQ  D:  03/06/2007  T:  03/06/2007  Job:  [id number]   cc:   Bernette Redbird, M.D.  Shirley Friar, MD  Danise Edge, M.D.  Adolph Pollack, M.D.

## 2011-01-25 NOTE — Consult Note (Signed)
NAME:  Debbie Spears, Debbie Spears NO.:  [id number]   MEDICAL RECORD NO.:  [id number]          PATIENT TYPE:  INP   LOCATION:  6739                         FACILITY:  [facility]   PHYSICIAN:  Shirley Friar, MDDATE OF BIRTH:  12-02-1911   DATE OF CONSULTATION:  DATE OF DISCHARGE:                                 CONSULTATION   We were asked to see Ms. Mckeel this morning in consultation for  recurrent diarrhea.   HISTORY OF PRESENT ILLNESS:  This is a 77 year old female in generally  good health until approximately May 1 when she had a sudden onset of  green, off-smelling diarrhea that awakened her from sleep.  The diarrhea  evolved into frequent, large-volume, watery postprandial stools for the  next 12-14 days.  She came to the hospital and was placed on Flagyl in  addition to Cipro and the diarrhea cleared.  It returned the day after  Flagyl was stopped.  At the time it watery, orange in color, large-  volume, and uncontrollable.  The patient came back to [facility] with  hypotensive episodes, was placed on vancomycin and the diarrhea cleared  within 24 hours.  She remained on vancomycin until pathology results  from a flexible sigmoidoscopy biopsy were benign.  The vancomycin was  stopped.  The patient experienced increased nausea and right upper  quadrant pain.  An EGD was done by Dr. Wandalee Ferdinand which was negative.  Serum H. pylori antibody was negative.  The patient underwent  cholecystectomy on June 20 and seemed to have improvement in her  symptoms of nausea and right upper quadrant pain.  The patient was  discharged on June 23.  At that point in time she was eating solid  foods, had no diarrhea, was not on any antibiotics.  The diarrhea  returned suddenly on June 24.  She had five to six episodes of large-  volume, clear, watery diarrhea that was uncontrollable.  The patient  became nonresponsive, experienced syncope, came to the hospital  dehydrated and  hypotensive.  She has had no bowel movement since coming  to the ER, which was approximately 22 hours ago.  She has also not eaten  any solid foods.  She has been repeatedly hypotensive.  Currently denies  pain but reports multiple episodes of dry heaving.  On previous  admissions stool cultures were found C. difficile negative, O&P  negative, fecal occult blood negative, Giardia negative, cryptosporidium  negative.  She was fecal lactoferrin positive.  Yeast was found in her  stool and she had reduced normal flora.   PAST MEDICAL HISTORY:  Significant for:  1. Hypertension.  2. High cholesterol.  3. Impaired fasting glucose.  4. Osteoarthritis.  5. Osteoporosis.   She has had no prior surgeries before her cholecystectomy done June 20.   CURRENT MEDICATIONS PRIOR TO ADMISSION:  Tenoretic, Benicar, K-Dur,  Tylenol, Lotensin and Norvasc.  She has sensitivity to [facility] as well as  COZAAR.   REVIEW OF SYSTEMS:  Significant for a weight loss of over 10 pounds in  the last 6 weeks.  This is important  because the patient only weighed  approximately 115 pounds to start with, and now probably weighs less  than 100.   SOCIAL HISTORY:  Significant for no alcohol, no tobacco.  She lives  alone, has one daughter named June who was her primary caretaker.   PHYSICAL EXAMINATION:  GENERAL:  She is awake and appropriate, appears  very frail and weak.  CARDIOVASCULAR SYSTEM:  Has a rhythm that is slow and slightly  irregular.  There is no appreciable murmur.  LUNGS:  Clear to auscultation but deep inspiration was not taken.  There  are no wheezes or crackles appreciated.  ABDOMEN:  Soft, nontender, nondistended.  She has hyperactive bowel  sounds.  Her surgical incisions are healing well.  LOWER EXTREMITIES:  There is some edema, right more than left.  It is  not pitting.   LABORATORIES:  Show a sodium 137, potassium 4.2, chloride 105, bicarb  26, BUN 13, creatinine 1.25, glucose 129.  Her  LFTs are normal.  Her  serum albumin is low at 1.5.  White count is 6.6; hemoglobin 11.1;  hematocrit 33.7; platelets 184,000.  Her urinary analysis is positive  for white blood cells too numerous to count, large amounts of bacteria,  many leukocyte esterase.  PT is 13.8.  Current vital signs are  temperature 98, pulse 74, respirations 24, blood pressure is 100/45.   Dr. Charlott Rakes has seen and examined the patient, collected a  history.  His assessment is that:  1. She is having recurrent large-volume watery diarrhea.  2. Repeated hypotension.  3. Weight loss secondary to severe malnutrition.  4. Current urinary tract infection.   PLAN:  Agree with TNA, low-fat clear-liquid diet for now.  Will add  Ensure or Resource Boost drink.  Will add Cipro for UTI, as well as  Flora-Q for reduced intestinal flora.  Recommend if diarrhea returns,  will consider colonoscopy, adding vancomycin, and cholestyramine.   Thanks very much for this consultation.      Stephani Police, [place]      Shirley Friar, MD  Electronically Signed    MLY/MEDQ  D:  03/07/2007  T:  03/07/2007  Job:  [id number]   cc:   Veverly Fells. Altheimer, M.D.

## 2011-01-25 NOTE — Discharge Summary (Signed)
NAME:  Debbie Spears, Debbie Spears NO.:  [id number]   MEDICAL RECORD NO.:  [id number]          PATIENT TYPE:  INP   LOCATION:  5152                         FACILITY:  [facility]   PHYSICIAN:  Veverly Fells. Altheimer, M.D.DATE OF BIRTH:  02/03/1912   DATE OF ADMISSION:  02/21/2007  DATE OF DISCHARGE:  03/05/2007                               DISCHARGE SUMMARY   REASON FOR ADMISSION:  Recurrent diarrhea with dehydration.   HISTORY:  This is a 77 year old woman, generally in good health,  admitted with recurrent severe diarrhea, mild dehydration, and  hypotension.  She had recently been admitted on Feb 07, 2007 with an 11-  day history of diarrhea which was not responding to conservative  management at home with antidiarrheals including Lomotil as well as  Welchol, Flora-Q, and Cipro.  She had had negative stool studies  including C difficile.  Her diarrhea subsided during that prior  admission when she was treated empirically with Cipro and then Flagyl;  and she had been discharged home on February 15, 2007, and completed a 7-day  course of Flagyl. However over the few days prior to this readmission,  she continued to have no appetite and little food intake, but her fluid  intake was reasonable.  On the morning of this readmission, she had  profuse diarrhea up to 12x per hour which was watery without  hematochezia or melena.  She also had nausea and vomiting for the first  time.  Her blood pressure was low; and she was brought by EMS to the  emergency room where she was treated with fluid resuscitation, but was  profoundly weak.  She denied abdominal pain other than some rumbling.  She remained intermittently hypotensive in the emergency room.  She had  also had a 10 pounds weight loss from 110 pounds to 100 pounds or lower.   PAST MEDICAL HISTORY:  1. Hypertension, resistant.  2. Hypercholesterolemia.  3. Impaired fasting glucose.  4. Osteoporosis by DEXA in 2005.  5. Osteoarthritis  left knee.  6. She had never had any surgeries.  7. She had never had any hospitalizations since childbirth other then      the recent one for diarrhea preceding the current admission.   DRUG SENSITIVITIES:  TENEX resulted in dry mouth and constipation.  COZAAR resulted in insomnia.   MEDICATIONS ON ADMISSION:  She had recently completed a course of Cipro  and Flagyl.  She was on Flora-Q, Lomotil and Welchol for the diarrhea.  Tenoretic 100 mg daily and Benicar 40 mg daily.  We had been holding her  Lotensin and Norvasc since late May 8.  K-Dur 20 mEq daily.  Calcium and  multivitamins.   SOCIAL HISTORY:  She is a widow who lives alone.  Her daughter, Aurea Graff and  others call or check of her daily.   REVIEW OF SYSTEMS:  Otherwise negative.   EXAM ON ADMISSION:  She was an alert, cooperative, 77 year old, white  female who appeared acutely ill, strikingly ashen and pale.  She was  alert and responding appropriately to questions.  She was hypotensive  with heart  rate 80.  She was afebrile.  Tongue was slightly dry.  Lungs  were clear.  Heart was regular without murmur.  Abdomen:  Soft,  nontender, nondistended with no mass.  Bowel sounds were present.   LABS ON ADMISSION:  Notable for WBC 18.6 with 95% neutrophils,  hemoglobin 16.1, potassium 3.4.  glucose 131, BUN 6, creatinine 1.39,  albumin 2.6, AST 42.  Cardiac enzymes negative.  Urinalysis negative.  EKG showed sinus rhythm with first-degree AV block, PACs, right axis  deviation, and possible old anterior infarct.   HOSPITAL COURSE:  She was seen in GI consultation in the emergency room  by Robin Searing, MD who suspected the high probability of C. difficile  colitis even though she had had several negative C. difficile toxin  studies as both outpatient and inpatient.  She was kept n.p.o. and then  started soon on clear liquids.  Fluid resuscitation was continued and  potassium was repleted.  She received a dose of Zofran with no  further  significant nausea during the early part of her hospital course.  Blood  cultures were obtained and ultimately negative.  Stool specimens were  again obtained and were ultimately entirely negative for pathogens and  C.  difficile toxin. By 02/22/2007 she was feeling a little bit better  with less stool output.  Blood pressure was 102 systolic.  WBC was down  to 9.9 and hemoglobin down to 11.9 with rehydration.  KUB showed no  acute findings with nonobstructive bowel gas pattern.  She was started  at time of admission on oral vancomycin for empiric coverage of likely C  difficile.  She was followed by GI. By June 13 she continued to feel  better, but continued with no appetite and little intake of p.o. fluids.  She was continuing to have 2-3 incontinent liquid stools per day.  Stool  fecal occult blood was positive.  She began, again, to have some mild  intermittent nausea which was thought possibly due to the vancomycin.  She underwent a flexible sigmoidoscopy with biopsies by Bernette Redbird,  MD on February 23, 2007 which showed a large amount of watery brown stool,  but no evident source of diarrhea, endoscopically evident.  Random  biopsies and C. difficile stool specimen were obtained.  The biopsy  returned negative with no evidence of C difficile.  She continued over  the next several days with the same pattern of watery diarrhea.  She  underwent ultrasound on 02/26/2007 which showed multiple gallstones.  Upper endoscopy on February 27, 2007 showed mild erythema of the stomach.  Biopsy for CLO test was obtained.  She was seen by nutrition for consult  for ongoing anorexia and was supplemented with Resource.  A colon biopsy  came back negative.   By about June 16 she was complaining of increasing episodes of nausea  and epigastric discomfort.  She was empirically started on Protonix on  June 17 .  Vancomycin was discontinued.  She was treated for perineal  candidiasis with Nystatin  cream.  Maalox was tried on June 19 with no  relief of her upper GI symptoms.  She was seen in surgical consultation  on March 01, 2007 by Dr. Abbey Chatters who agreed that this may be an  atypical presentation of gallbladder disease and recommended  cholecystectomy.  She underwent laparoscopic cholecystectomy with  intraoperative cholangiogram on March 02, 2007.   Surgical findings included three very large gallstones and evidence of  chronic gallbladder inflammation.  By  the first postoperative day she  reported that she felt the best that she had felt in a month.  There was  no further nausea, dyspepsia, or diarrhea.  Her appetite was much  improved; and she was tolerating diet.  Overall she felt much better.  By June 22 she was continuing to feel quite well.  She still had no GI  symptoms.  She was beginning to ambulate, but was still rather weak.  WBC was 5.8 and CMP was normal except albumin 1.7.  By June 22 she was  feeling significantly stronger and had been able to walk some in the  hall.  She had a bowel movement after a single dose of milk of magnesia,  but no other stool output.   CONDITION ON DISCHARGE:  Much improved.   FINAL DIAGNOSIS:  1. Chronic diarrhea, apparently resolved, subsequent to laparoscopic      cholecystectomy.  In retrospect, it is felt likely that the 48-month      history of diarrhea was an atypical presentation of gallbladder      disease.  2. Status post laparoscopic cholecystectomy March 02, 2007 for multiple      large gallstones and chronic cholecystitis.  3. Dehydration secondary to above, resolved.  4. Hypotension secondary to the above, resolved.  5. Hypokalemia.  6. Generalized weakness, improving.  7. Hypertension, resistant.  8. Dyslipidemia.  9. Impaired fasting glucose  10.Osteoporosis.  11.Osteoarthritis.   PLAN:  She is to be discharged home where her daughter, Aurea Graff, will help  look after her as needed.   DIET:  As tolerated.    ACTIVITY:  She is to gradually increase activity including walking with  assistance.  Wound care as instructed by Dr. Abbey Chatters.   MEDICATIONS:  1. Tenoretic 100 mg daily.  2. Benicar 40 mg daily.  3. K-Dur 20 mEq daily.  4. Tylenol 1000 mg q.6 h. p.r.n. incisional pain.  5. Nystatin cream b.i.d. as needed for perineal Candidiasis.  6. For the time being she is to remain OFF Lotensin, and Norvasc.   FOLLOWUP:  Follow up With Dr. Avel Peace, M.D. in 2-3 weeks.  Follow up with Veverly Fells. Altheimer, M.D. in 2-3 weeks.  She will  probably try to do both visits on the same day.  They are to call sooner  if there are any problems with recurrent symptoms, significant fever, or  other problems.      Veverly Fells. Altheimer, M.D.  Electronically Signed     MDA/MEDQ  D:  03/05/2007  T:  03/05/2007  Job:  [id number]   cc:   Adolph Pollack, M.D.  Graylin Shiver, M.D.  Danise Edge, M.D.  Bernette Redbird, M.D.

## 2011-01-25 NOTE — H&P (Signed)
NAME:  Debbie Spears, NOTTE NO.:  [id number]   MEDICAL RECORD NO.:  [id number]          PATIENT TYPE:  EMS   LOCATION:  [facility]                         FACILITY:  [facility]   PHYSICIAN:  Veverly Fells. Altheimer, M.D.DATE OF BIRTH:  16-May-1912   DATE OF ADMISSION:  02/21/2007  DATE OF DISCHARGE:                              HISTORY & PHYSICAL   HISTORY OF PRESENT ILLNESS:  This is a 77 year old woman, generally in  good health, admitted with recurrent severe diarrhea, mild dehydration  and hypotension. She was recently admitted on 02/07/07, with an 11-day  history of diarrhea which was not responding to conservative management  at home with Lomotil, Welchol and Flora-Q as well as a 1-day history of  Cipro started empirically prior to admission. She had progressed at that  time to the point of dehydration and progressive weakness. She had also  been on Pedialyte at home. She had been seen in the office on 5/22, with  a benign abdominal exam. She continued having about 4 watery stools per  day, mainly post prandial, leading up to that admission. She had denied  any abdominal pain or blood in the stools. At the time of that admission  on 02/07/07, her blood pressure was 94/54, abdominal exam was normal and  WBC was 11.1 with 79 neutrophils. Her electrolytes were normal. BUN was  39 and creatinine 2.4. She was rehydrated, felt significantly better,  but continued having several watery stools per day from the time of that  admission until about 6/3, when the diarrhea finally subsided to about 1  semiformed stool per day. During that hospitalization the Cipro was  continued for about 6 days and then switched empirically to Flagyl. She  was discharged home on 02/15/07, and completed a 7-day course of Flagyl  yesterday. She also continued on Flora-Q and Welchol and some Lomotil.  At home over the past few days she has continued to have no appetite and  little food intake but fluid intake  according to her daughter, Aurea Graff, was  quite good. By 02/19/07 and 02/20/07, her stools were more formed, she was  generally feeling better, was stronger and ambulatory.   However, this morning she felt much worse, had profuse diarrhea up to  about 12 times per hour which was watery without hematochezia or melena.  This morning she also had nausea and vomiting for the first time. Her  daughter Aurea Graff was unable to get a blood pressure and called EMS and,  indeed, on arrival in the emergency room her blood pressure was low at  72/49. She has received about 1.5 liters of saline, but continues to  feel profoundly weak. She denies abdominal pain other than the same type  of rumbling that she had with her active diarrhea previously. She  complains also of dry mouth. She has not had any fevers or chills.   Blood pressure came up to 134/53 after about a liter of fluid, but it  went back down to 86/45 on the automated blood pressure monitor and then  down to 60 by palpation on my exam at about  7:00 p.m. Repeat blood  pressure is 90 by palpation currently at about 1940 hours. She has had 1  dose of Zofran 4 mg IV.   She has had a 10-pound weight loss from 110 pounds to 100 pounds as of  02/06/07. She has likely had some additional weight loss since then.   PAST MEDICAL HISTORY:  1. Hypertension, resistant, requiring 5 medications with blood      pressure 140/68 in April. She has been down to about 2 blood      pressure medications since her 02/15/07, discharge.  2. Hypercholesterolemia but with high HDL.  3. Impaired fasting glucose.  4. Osteoporosis by DEXA in 2005.  5. Osteoarthritis, left knee.  6. She has not had any surgeries. She has not had any hospitalizations      since childbirth other than the recent one described above with      diarrhea.   DRUG SENSITIVITIES:  1. TENEX RESULTED IN DRY MOUTH AND CONSTIPATION.  2. COZAAR RESULTED IN INSOMNIA.   MEDICATIONS:  As noted above, she  completed a course of Cipro during her  recent hospitalization and was switched over to Flagyl which was  completed yesterday after a 7-day course.  Antidiarrhea medications as noted above including Flora-Q, Lomotil and  Welchol.  Tenoretic 100 mg daily.  Benicar 40 mg daily.  We have been holding her Lotensin and Norvasc since late May.  K-Dur 20 mEq daily which was changed yesterday to a liquid position  supplement of which she has received just 1 dose. Calcium and  multivitamin.   FAMILY HISTORY:  Mother died at age 50 of stroke. Brother died age 64 of  MI. She has 1 child.   SOCIAL HISTORY:  She is a widow. She lives alone. Her daughter Aurea Graff and  others call or check on her daily. With the recent illness, her daughter  was staying with her at night, at last until yesterday. She does not  smoke or drink alcohol.   REVIEW OF SYSTEMS:  She denies dyspnea or chest pain or palpitations.  GASTROINTESTINAL: As above. GENITOURINARY: No complaints.  MUSCULOSKELETAL: No complaints. MUSCULOSKELETAL: No complaints.   PHYSICAL EXAMINATION:  GENERAL APPEARANCE: Alert, pleasant, cooperative,  77 year old white female who appears acutely ill. She is strikingly  ashen and pale. She is oriented and responds appropriately to questions.  VITAL SIGNS: She is hypotensive as above. Heart rate is 80 and  respiratory rate 15, temperature 97.8, 02 sat 94% on room air.  SKIN: Negative except for the striking pallor and ashen appearance.  HEENT: Tongue is slightly dry.  NECK: Supple without thyromegaly, with carotid upstrokes normal without  bruit.  LUNGS: Unlabored and clear.  HEART: Regular without murmur.  ABDOMEN: Soft, nontender, nondistended. No mass with bowel sounds  present. Abdominal exam is unchanged from her recent hospitalization.  EXTREMITIES: Pedal pulses intact without edema.  NEUROLOGIC: Without focal deficit. She is able to sit up with minimal  assistance. She is able to lift her  legs with good strength.   LABORATORY DATA:  WBC 18.6 with 95 neutrophils. Hemoglobin 16.1. Sodium  137, potassium 3.4, C02 23, glucose 131, BUN 6, creatinine 1.39, albumin  2.6, AST 42. Cardiac enzymes negative.   Urinalysis negative.   EKG shows sinus rhythm with first-degree AV block, PACs, right axis  deviation and possible old anterior infarct.   ASSESSMENT:  1. Diarrhea since 01/27/07, of determined etiology. She had been      improving subsequent to recent  hospitalization for the same      problem, with a sudden and severe exacerbation today.  2. Nausea and vomiting today.  3. Dehydration secondary to above.  4. Hypotension secondary to above.  5. Generalized weakness secondary to above.  6. Hypertension, resistant in the past, currently hypotensive as      above.  7. Dyslipidemia.  8. Impaired fasting glucose.  9. Osteoporosis.  10.Osteoarthritis.  11.Hypokalemia with current potassium 3.4.   PLAN:  Will admit to step-down unit or possibly ICU if her blood  pressure remains severely low. Will monitor blood pressure closely. She  may need dopamine. If she does or is sick enough to need to go to the  ICU, will get a critical care consult. Will keep her NPO for now,  consider clear liquids later tonight if she is feeling better. Will  complete the second liter of normal saline quickly and then covert to D5  half-normal saline with potassium 10 mEq/liter at 150 per hour for 6  hours, then 100 per hour. Will place a Foley and start oxygen. Zofran if  needed. Will obtain blood cultures and tentatively plan to start her on  Fortaz for empiric coverage for possible early sepsis. Will repeat labs  in the morning. Will obtain stool specimen again for WBCs, culture, ova  and parasites, C. difficile and Hemoccult. Will request GI followup,  which was discussed with Danise Edge, MD who is covering tonight.  Would consider whether she should have a flexible sigmoidoscopy and/or   abdominal CT scan at this point. Will discuss that further this evening  with Dr. Laural Benes.      Veverly Fells. Altheimer, M.D.  Electronically Signed     MDA/MEDQ  D:  02/21/2007  T:  02/21/2007  Job:  [id number]   cc:   Shirley Friar, MD  Danise Edge, M.D.

## 2011-01-25 NOTE — Op Note (Signed)
NAME:  Debbie Spears, Debbie Spears NO.:  [id number]   MEDICAL RECORD NO.:  [id number]          PATIENT TYPE:  INP   LOCATION:  4734                         FACILITY:  [facility]   PHYSICIAN:  Graylin Shiver, M.D.   DATE OF BIRTH:  [birth year]   DATE OF PROCEDURE:  02/27/2007  DATE OF DISCHARGE:                               OPERATIVE REPORT   PROCEDURE:  Esophagogastroduodenoscopy with biopsy.   INDICATION:  Epigastric pain.   PREMEDICATION:  Fentanyl 25 mcg IV, Versed 2 mg IV.   PROCEDURE:  With the patient in the left lateral decubitus position, the  Pentax gastroscope was inserted into the oropharynx and passed into the  esophagus.  It was advanced down the esophagus then into the stomach and  into the duodenum.  Second portion and bulb of the duodenum were normal.  The stomach showed some mild erythema.  Biopsy for CLO-test was  obtained.  No other abnormalities were noted in the stomach.  The  esophagus looked normal.  She tolerated the procedure well without  complications.   IMPRESSION:  Mild erythema of the stomach.  Biopsy for CLO-test  obtained.   I see nothing specifically to explain epigastric pain.   PLAN:  We will try Protonix.  If the epigastric pain continues despite  the Protonix, then this may be secondary to gallstones which we just  found out that she had based on ultrasound           ______________________________  Graylin Shiver, M.D.     SFG/MEDQ  D:  02/27/2007  T:  02/28/2007  Job:  [id number]   cc:   Veverly Fells. Altheimer, M.D.

## 2011-01-28 NOTE — Discharge Summary (Signed)
NAME:  Debbie Spears, Debbie Spears NO.:  [id number]   MEDICAL RECORD NO.:  [id number]          PATIENT TYPE:  INP   LOCATION:  5028                         FACILITY:  [facility]   PHYSICIAN:  Veverly Fells. Altheimer, M.D.DATE OF BIRTH:  [birth year]   DATE OF ADMISSION:  02/07/2007  DATE OF DISCHARGE:  02/15/2007                               DISCHARGE SUMMARY   REASON FOR ADMISSION:  Diarrhea for 11 days with dehydration and  weakness.   This is a 77 year old remarkably healthy woman with really no  significant medical problems other than resistant hypertension who  developed diarrhea about 11 days prior to admission.  Treatment included  Imodium and Pedialyte.  Lomotil was added on Jan 30, 2007.  She was seen  in the office on Feb 01, 2007 and continued to have 3 or 4 watery stools  per day, mainly postprandial.  There was no significant abdominal pain  and no hematochezia or melena.  There was no fever.  When she was seen  in the office on Feb 01, 2007, her abdominal exam was normal.  Welchol  was added, and Lotensin and Norvasc were held due to mild hypotension.  She returned to the office on Feb 06, 2007 complaining of ongoing  diarrhea with the same pattern as before.  Her weight was down about 10  pounds, and she was somewhat weak with blood pressure of 114/56.  WBC  was 6.0.  CRP was 11.  BUN 33 and creatinine 1.8.  Stool was obtained  for culture, ova and parasites and Giardia EIA, and she was empirically  started on Cipro 250 mg b.i.d. and Flora-Q b.i.d.  Arrangements were  made for a home health visit for IV fluid, but that was deferred until  the next day, by which point she was weaker and more dehydrated and was  brought by her daughter to the emergency department.Marland Kitchen   PAST MEDICAL HISTORY:  1. Hypertension, resistant, requiring five medications.  2. Hypercholesterolemia, but with high HDL.  3. Impaired fasting glucose.  4. Osteoporosis.  5. Osteoarthritis, left  knee.   DRUG SENSITIVITIES:  1. TENEX resulted in dry mouth and constipation.  2. COZAAR resulted in insomnia.   MEDICATIONS ON ADMISSION:  1. As above for the diarrhea.  2. Cipro as above started the day prior to admission.  3. Lotensin 40 mg daily, held for several days.  4. Norvasc 10 mg daily, held for several days.  5. Tenoretic 100 mg daily.  6. Benicar 40 mg daily.  7. K-Dur 20 mEq daily.  8. Calcium.  9. Multivitamin.   FAMILY HISTORY/SOCIAL HISTORY/REVIEW OF SYSTEMS:  Noncontributory.  See  admission note.   PHYSICAL EXAMINATION ON ADMISSION:  GENERAL:  She was an alert,  pleasant, cooperative 77 year old white female resting comfortably.  VITAL SIGNS:  Afebrile with blood pressure 80/35 initially in the  emergency room, coming up to 137/54.  HEENT:  Oropharynx was initially dry but improving on IV fluid.  ABDOMEN:  Soft, nontender, with no mass and active bowel sounds.  (Exam was otherwise unremarkable.)   LABORATORIES ON ADMISSION:  Notable for WBC of 11.1 with 79%  neutrophils.  Electrolytes normal, BUN 39, creatinine 2.4.   HOSPITAL COURSE:  She was continued on IV saline.  She was continued on  Cipro empirically pending results of stool studies.  She was continued  empirically also on Flora-Q, Lomotil and Welchol.  Blood pressure  medications were held other than the Tenormin.  She continued in the  hospital to have watery diarrhea, mainly after any p.o. intake including  clear liquids.  Her stool studies from outpatient and repeat stool  studies including Clostridium difficile were all negative.  By Feb 09, 2007, she was feeling a little better with rehydration but continued to  have about 3 watery loose stools per day.  WBC was 9.3 and electrolytes  remained normal.  Albumin was 2.2.  Hemoglobin came down from 17.3 to  12.7 with rehydration.  The Cipro was changed empirically to IV.  She  was seen in GI consultation by Charlott Rakes, M.D. on Feb 09, 2007,  who agreed with rechecking the stool studies.  It was thought unlikely  that this was an ischemic process.  It was thought most likely to be an  infectious process.  Empiric treatment continuation was recommended.  Questran was added.  By February 12, 2007, she was continuing with about 5  watery postprandial stools per day.  She was still rather weak.  She was  started on February 12, 2007 on Flagyl empirically.  Cipro was stopped on  February 13, 2007.  WBC on February 14, 2007 was 6.1 with albumin of 2.1 and  repeat Clostridium difficile again was negative.  She was seen by  physical therapy because of generalized weakness and deconditioning.  On  February 15, 2007, she was doing better with soft semi-formed stools down to  one per day.  She was able to walk in the hall with improved strength  and wanted to go home.   FINAL DIAGNOSES:  1. Diarrhea of uncertain etiology, probably infectious, with onset on      Jan 27, 2007.  2. Dehydration secondary to #1.  3. Renal insufficiency secondary to above, improving.  4. Generalized weakness secondary to above, improving.  5. Hypertension, resistant.  6. Dyslipidemia.  7. Impaired fasting glucose.  8. Osteoporosis.  9. Osteoarthritis.   DISCHARGE/PLAN:  Soft diet.  Increase activity as tolerated.  Tenoretic  100 mg daily, Benicar 40 mg daily, K-Dur 20 mEq daily, calcium daily,  multivitamin daily, Flagyl 250 mg t.i.d. for 4 more days, Flora-Q one  b.i.d. for 2 weeks then daily for 4 more weeks, Lomotil p.r.n., Welchol  p.r.n. diarrhea.  She is to remain off the Lotensin and Norvasc.  Follow-  up with Veverly Fells. Altheimer, M.D. in about 2 weeks.  She is to call  sooner for significant recurrence of diarrhea or other concerns.  Discharge instructions were reviewed in detail with the patient, as well  as her daughter, Aurea Graff.     Veverly Fells. Altheimer, M.D.  Electronically Signed    MDA/MEDQ  D:  04/15/2007  T:  04/16/2007  Job:  [id number]

## 2011-02-09 ENCOUNTER — Other Ambulatory Visit: Payer: Self-pay | Admitting: *Deleted

## 2011-02-09 MED ORDER — HYDRALAZINE HCL 25 MG PO TABS: ORAL_TABLET | ORAL | Status: DC

## 2011-02-14 ENCOUNTER — Encounter: Payer: Self-pay | Admitting: Internal Medicine

## 2011-02-17 ENCOUNTER — Telehealth: Payer: Self-pay | Admitting: Internal Medicine

## 2011-02-17 NOTE — Telephone Encounter (Signed)
Pt daughter wants to know why her mother has  An appointment  set up for echo on 6/11 . She doesn't know nothing about it.

## 2011-02-17 NOTE — Telephone Encounter (Signed)
Spoke with pt's daughter who is questioning reason for upcoming echo appt.  Upon reviewing chart I can not find any reason for echo to be done.  Daughter reports pt is doing well and has had no recent changes in medical condition. She is aware of appt with Dr. Tenny Craw for March 03, 2011.  Will cancel echo.

## 2011-02-21 ENCOUNTER — Other Ambulatory Visit (HOSPITAL_COMMUNITY): Payer: Self-pay | Admitting: Radiology

## 2011-03-03 ENCOUNTER — Encounter: Payer: Self-pay | Admitting: Internal Medicine

## 2011-03-03 ENCOUNTER — Ambulatory Visit (INDEPENDENT_AMBULATORY_CARE_PROVIDER_SITE_OTHER): Payer: Medicare Other | Admitting: Internal Medicine

## 2011-03-03 VITALS — BP 155/66 | HR 60 | Resp 18 | Ht 62.0 in | Wt 106.4 lb

## 2011-03-03 DIAGNOSIS — I10 Essential (primary) hypertension: Secondary | ICD-10-CM

## 2011-03-03 DIAGNOSIS — E785 Hyperlipidemia, unspecified: Secondary | ICD-10-CM

## 2011-03-03 DIAGNOSIS — I4891 Unspecified atrial fibrillation: Secondary | ICD-10-CM

## 2011-03-03 NOTE — Progress Notes (Signed)
HPI Patient is a 77 year old with a history of diastolic CHF, HTN, dyslipidemia.  I saw her last fall. Since seen she and her daughter say that she has been doing very good.  She denies SOB.  No CP.  No dizziness. No edemia.  No palpitations.  No weakness.  Note that she stopped taking coumadin.  Allergies  Allergen Reactions  . Olmesartan Medoxomil     Current Outpatient Prescriptions  Medication Sig Dispense Refill  . amLODipine (NORVASC) 5 MG tablet TAKE ONE TABLET BY MOUTH DAILY  30 tablet  6  . aspirin EC 325 MG tablet Take 325 mg by mouth daily.        . benazepril (LOTENSIN) 20 MG tablet TAKE 2 TABLETS EVERY MORNING AND 1 TABLET AT NIGHT  90 tablet  6  . Calcium Carbonate Antacid (TUMS PO) Take by mouth as needed.        Tery Sanfilippo Sodium (COLACE PO) Take by mouth as needed.        . furosemide (LASIX) 40 MG tablet TAKE 1 TABLET EVERY DAY  30 tablet  6  . hydrALAZINE (APRESOLINE) 25 MG tablet Take 1 tablet every 6 hours  120 tablet  6  . KLOR-CON M20 20 MEQ tablet TAKE ONE TABLET BY MOUTH DAILY  30 tablet  6  . Pediatric Multivitamins-Fl (MULTI VIT/FL PO) Take by mouth daily.        Marland Kitchen DISCONTD: Bisacodyl (DULCOLAX PO) Take by mouth. As needed          Past Medical History  Diagnosis Date  . Hypertension   . Hyperlipidemia   . Glucose intolerance (impaired glucose tolerance)   . Osteoporosis   . Osteoarthritis   . Atrial fibrillation   . Diastolic CHF, acute     Past Surgical History  Procedure Date  . Laparoscopic cholecystectomy March 02, 2007    Family History  Problem Relation Age of Onset  . Stroke Mother   . Heart attack Brother     History   Social History  . Marital Status: Widowed    Spouse Name: N/A    Number of Children: N/A  . Years of Education: N/A   Occupational History  . Not on file.   Social History Main Topics  . Smoking status: Never Smoker   . Smokeless tobacco: Not on file  . Alcohol Use: No  . Drug Use:   . Sexually Active:      Other Topics Concern  . Not on file   Social History Narrative   Widow and lives alone. Daughter Aurea Graff and others call or check on her daily.     Review of Systems:  All systems reviewed.  They are negative to the above problem except as previously stated.  Vital Signs: BP 155/66  Pulse 60  Resp 18  Ht 5\' 2"  (1.575 m)  Wt 106 lb 6.4 oz (48.263 kg)  BMI 19.46 kg/m2  Physical Exam Patient is in NAD HEENT:  Normocephalic, atraumatic. EOMI, PERRLA.  Neck: JVP is normal. No thyromegaly. No bruits.  Lungs: clear to auscultation. No rales no wheezes.  Heart: Irregular rate and rhythm. Normal S1, S2. No S3.   No significant murmurs. PMI not displaced.  Abdomen:  Supple, nontender. Normal bowel sounds. No masses. No hepatomegaly.  Extremities:   Good distal pulses throughout. Patient with support hose on.  Trivial lower extremity edema.  Musculoskeletal :moving all extremities.  Neuro:   alert and oriented x3.  CN  II-XII grossly intact. EKG:  Atrial fibrillation.  81.   LVH.  Assessment and Plan:

## 2011-03-03 NOTE — Assessment & Plan Note (Signed)
No changes

## 2011-03-03 NOTE — Assessment & Plan Note (Signed)
Rates are good.  Keep on ASA.  Patient does not want coumadin.

## 2011-03-03 NOTE — Assessment & Plan Note (Signed)
BP is a little high today.  It was 120/ at Dr. Altheimer's office. I runs around that at home.  I would not change regimen.

## 2011-06-28 LAB — 5 HIAA, QUANTITATIVE, URINE, 24 HOUR
5-HIAA, 24 Hr Urine: 2.1 mg/24 h (ref <=6.0)
Volume, Urine-5HIAA: 650 mL/24 h

## 2011-06-28 LAB — URINALYSIS, ROUTINE W REFLEX MICROSCOPIC
Hgb urine dipstick: NEGATIVE
Nitrite: NEGATIVE
Protein, ur: NEGATIVE
Specific Gravity, Urine: 1.02
Urobilinogen, UA: 0.2

## 2011-06-28 LAB — BASIC METABOLIC PANEL
BUN: 10
CO2: 29
Calcium: 8.1 — ABNORMAL LOW
Chloride: 105
Chloride: 113 — ABNORMAL HIGH
GFR calc Af Amer: >60
GFR calc Af Amer: >60
GFR calc Af Amer: >60
GFR calc non Af Amer: >60
GFR calc non Af Amer: >60
Glucose, Bld: 73
Potassium: 3.9
Potassium: 4
Sodium: 137
Sodium: 137
Sodium: 140

## 2011-06-28 LAB — COMPREHENSIVE METABOLIC PANEL
ALT: 16
ALT: 22
AST: 26
AST: 29
AST: 33
AST: 42 — ABNORMAL HIGH
Albumin: 1.8 — ABNORMAL LOW
Albumin: 1.8 — ABNORMAL LOW
Albumin: 2.1 — ABNORMAL LOW
Alkaline Phosphatase: 83
BUN: 13
BUN: 18
CO2: 16 — ABNORMAL LOW
CO2: 30
Calcium: 8.1 — ABNORMAL LOW
Calcium: 8.2 — ABNORMAL LOW
Calcium: 8.3 — ABNORMAL LOW
Chloride: 105
Chloride: 111
Creatinine, Ser: 0.72
Creatinine, Ser: 0.99
GFR calc Af Amer: >60
GFR calc Af Amer: >60
GFR calc Af Amer: >60
GFR calc Af Amer: >60
GFR calc non Af Amer: 52 — ABNORMAL LOW
GFR calc non Af Amer: >60
Potassium: 4.4
Sodium: 136
Sodium: 139
Total Bilirubin: 0.4
Total Bilirubin: 1.1
Total Protein: 4.7 — ABNORMAL LOW
Total Protein: 5.5 — ABNORMAL LOW

## 2011-06-28 LAB — CBC
HCT: 33.6 — ABNORMAL LOW
HCT: 36.8
MCHC: 33.1
MCHC: 33.3
MCV: 92.6
MCV: 93.5
Platelets: 211
Platelets: 232
Platelets: 237
Platelets: 259
RBC: 3.56 — ABNORMAL LOW
RBC: 3.94
RBC: 3.98
RDW: 13.5
RDW: 13.7
WBC: 5.8
WBC: 6.1
WBC: 8.6

## 2011-06-28 LAB — TRIGLYCERIDES: Triglycerides: 108

## 2011-06-28 LAB — MAGNESIUM: Magnesium: 1.5

## 2011-06-28 LAB — TSH: TSH: 1.022

## 2011-06-28 LAB — PHOSPHORUS: Phosphorus: 3.3

## 2011-06-29 LAB — MAGNESIUM: Magnesium: 1.1 — ABNORMAL LOW

## 2011-06-29 LAB — COMPREHENSIVE METABOLIC PANEL
ALT: 16
ALT: 17
ALT: 17
AST: 26
AST: 29
AST: 30
AST: 30
AST: 30
Albumin: 1.5 — ABNORMAL LOW
Albumin: 1.6 — ABNORMAL LOW
Albumin: 1.7 — ABNORMAL LOW
Albumin: 1.8 — ABNORMAL LOW
Alkaline Phosphatase: 54
Alkaline Phosphatase: 57
Alkaline Phosphatase: 60
Alkaline Phosphatase: 68
Alkaline Phosphatase: 79
BUN: 13
BUN: <1 — ABNORMAL LOW
BUN: <1 — ABNORMAL LOW
CO2: 28
CO2: 29
CO2: 32
Calcium: 6.5 — ABNORMAL LOW
Chloride: 102
Chloride: 105
Chloride: 106
Chloride: 110
Creatinine, Ser: 0.72
Creatinine, Ser: 0.81
Creatinine, Ser: 0.86
GFR calc Af Amer: 48 — ABNORMAL LOW
GFR calc Af Amer: >60
GFR calc Af Amer: >60
GFR calc Af Amer: >60
GFR calc Af Amer: >60
GFR calc non Af Amer: >60
GFR calc non Af Amer: >60
Glucose, Bld: 135 — ABNORMAL HIGH
Glucose, Bld: 85
Potassium: 3.4 — ABNORMAL LOW
Potassium: 3.5
Potassium: 3.9
Potassium: 4.1
Potassium: 4.2
Sodium: 138
Sodium: 140
Sodium: 140
Total Bilirubin: 0.5
Total Bilirubin: 0.5
Total Bilirubin: 0.5
Total Bilirubin: 0.6
Total Bilirubin: 0.6
Total Protein: 4 — ABNORMAL LOW
Total Protein: 4.4 — ABNORMAL LOW
Total Protein: 4.5 — ABNORMAL LOW

## 2011-06-29 LAB — DIFFERENTIAL
Basophils Absolute: 0
Basophils Absolute: 0
Basophils Relative: 0
Basophils Relative: 0
Eosinophils Absolute: 0
Eosinophils Absolute: 0.1
Eosinophils Relative: 0
Eosinophils Relative: 2
Eosinophils Relative: 2
Lymphocytes Relative: 17
Lymphocytes Relative: 29
Lymphocytes Relative: 3 — ABNORMAL LOW
Lymphs Abs: 0.4 — ABNORMAL LOW
Monocytes Absolute: 0.4
Monocytes Absolute: 0.5
Monocytes Absolute: 0.6
Monocytes Absolute: 0.7
Monocytes Relative: 13 — ABNORMAL HIGH
Monocytes Relative: 3
Monocytes Relative: 8
Monocytes Relative: 9
Neutro Abs: 13.5 — ABNORMAL HIGH
Neutro Abs: 3.3
Neutrophils Relative %: 69
Neutrophils Relative %: 94 — ABNORMAL HIGH

## 2011-06-29 LAB — URINALYSIS, ROUTINE W REFLEX MICROSCOPIC
Glucose, UA: NEGATIVE
Glucose, UA: NEGATIVE
Hgb urine dipstick: NEGATIVE
Ketones, ur: NEGATIVE
Nitrite: NEGATIVE
Protein, ur: NEGATIVE
Urobilinogen, UA: 0.2
pH: 7

## 2011-06-29 LAB — BASIC METABOLIC PANEL
BUN: 2 — ABNORMAL LOW
BUN: 2 — ABNORMAL LOW
BUN: <1 — ABNORMAL LOW
CO2: 24
CO2: 28
CO2: 28
Calcium: 6.6 — ABNORMAL LOW
Calcium: 7.7 — ABNORMAL LOW
Chloride: 103
Chloride: 108
Creatinine, Ser: 0.7
Creatinine, Ser: 0.73
GFR calc Af Amer: 55 — ABNORMAL LOW
GFR calc Af Amer: >60
GFR calc non Af Amer: 45 — ABNORMAL LOW
GFR calc non Af Amer: >60
GFR calc non Af Amer: >60
Glucose, Bld: 100 — ABNORMAL HIGH
Glucose, Bld: 96
Potassium: 3.1 — ABNORMAL LOW
Potassium: 3.6
Potassium: 3.7
Sodium: 140

## 2011-06-29 LAB — CBC
HCT: 34 — ABNORMAL LOW
HCT: 35.8 — ABNORMAL LOW
HCT: 36.5
Hemoglobin: 10.6 — ABNORMAL LOW
Hemoglobin: 11.1 — ABNORMAL LOW
Hemoglobin: 11.9 — ABNORMAL LOW
Hemoglobin: 15.1 — ABNORMAL HIGH
MCHC: 32.9
MCHC: 33.2
MCV: 91.7
MCV: 93.6
Platelets: 174
Platelets: 177
Platelets: 187
RBC: 3.44 — ABNORMAL LOW
RBC: 3.6 — ABNORMAL LOW
RBC: 3.64 — ABNORMAL LOW
RBC: 4.93
RDW: 13.4
RDW: 13.7
RDW: 13.7
RDW: 13.8
RDW: 13.9
WBC: 14.4 — ABNORMAL HIGH
WBC: 6.1
WBC: 6.9

## 2011-06-29 LAB — URINE MICROSCOPIC-ADD ON

## 2011-06-29 LAB — STOOL CULTURE

## 2011-06-29 LAB — TRIGLYCERIDES: Triglycerides: 108

## 2011-06-29 LAB — FECAL LACTOFERRIN, QUANT

## 2011-06-29 LAB — PREALBUMIN: Prealbumin: 9.8 — ABNORMAL LOW

## 2011-06-29 LAB — OVA AND PARASITE EXAMINATION: Ova and parasites: NONE SEEN

## 2011-06-29 LAB — OCCULT BLOOD X 1 CARD TO LAB, STOOL: Fecal Occult Bld: NEGATIVE

## 2011-06-29 LAB — PROTIME-INR: INR: 1

## 2011-06-29 LAB — CLOSTRIDIUM DIFFICILE EIA

## 2011-06-30 LAB — COMPREHENSIVE METABOLIC PANEL
ALT: 16
ALT: 16
ALT: 22
ALT: 25
AST: 31
AST: 33
AST: 42 — ABNORMAL HIGH
Albumin: 1.7 — ABNORMAL LOW
Albumin: 1.9 — ABNORMAL LOW
Albumin: 2.1 — ABNORMAL LOW
Alkaline Phosphatase: 47
Alkaline Phosphatase: 50
BUN: 11
BUN: 3 — ABNORMAL LOW
CO2: 23
CO2: 23
CO2: 24
Calcium: 6.4 — CL
Calcium: 7.7 — ABNORMAL LOW
Calcium: 7.9 — ABNORMAL LOW
Chloride: 106
Chloride: 111
Chloride: 113 — ABNORMAL HIGH
Chloride: 113 — ABNORMAL HIGH
Creatinine, Ser: 0.84
Creatinine, Ser: 0.85
Creatinine, Ser: 1.31 — ABNORMAL HIGH
Creatinine, Ser: 1.39 — ABNORMAL HIGH
GFR calc Af Amer: 46 — ABNORMAL LOW
GFR calc Af Amer: >60
GFR calc non Af Amer: 25 — ABNORMAL LOW
GFR calc non Af Amer: 35 — ABNORMAL LOW
GFR calc non Af Amer: >60
Glucose, Bld: 131 — ABNORMAL HIGH
Glucose, Bld: 138 — ABNORMAL HIGH
Glucose, Bld: 98
Potassium: 3.4 — ABNORMAL LOW
Potassium: 3.9
Sodium: 138
Sodium: 139
Sodium: 140
Total Bilirubin: 0.5
Total Bilirubin: 0.5
Total Bilirubin: 0.7
Total Bilirubin: 0.9
Total Protein: 3.9 — ABNORMAL LOW
Total Protein: 4.5 — ABNORMAL LOW

## 2011-06-30 LAB — CBC
HCT: 35.2 — ABNORMAL LOW
HCT: 37.1
Hemoglobin: 11.9 — ABNORMAL LOW
Hemoglobin: 16.1 — ABNORMAL HIGH
MCHC: 33.2
MCV: 93.2
MCV: 94.4
Platelets: 158
Platelets: 163
Platelets: 166
Platelets: 166
RBC: 3.79 — ABNORMAL LOW
RBC: 3.82 — ABNORMAL LOW
RDW: 13.5
RDW: 13.9
RDW: 14
RDW: 14.1 — ABNORMAL HIGH
WBC: 18.6 — ABNORMAL HIGH
WBC: 8.2
WBC: 9.9

## 2011-06-30 LAB — DIFFERENTIAL
Basophils Absolute: 0
Basophils Absolute: 0
Basophils Absolute: 0
Basophils Relative: 0
Eosinophils Absolute: 0
Eosinophils Absolute: 0.2
Eosinophils Relative: 0
Eosinophils Relative: 2
Eosinophils Relative: 3
Eosinophils Relative: 3
Lymphocytes Relative: 18
Lymphocytes Relative: 19
Lymphocytes Relative: 21
Lymphs Abs: 0.4 — ABNORMAL LOW
Lymphs Abs: 1.3
Lymphs Abs: 1.6
Monocytes Absolute: 0.6
Monocytes Absolute: 0.7
Monocytes Relative: 12 — ABNORMAL HIGH
Monocytes Relative: 8
Neutro Abs: 5.8
Neutro Abs: 8 — ABNORMAL HIGH
Neutrophils Relative %: 71
Neutrophils Relative %: 81 — ABNORMAL HIGH
Neutrophils Relative %: 95 — ABNORMAL HIGH
WBC Morphology: INCREASED

## 2011-06-30 LAB — POCT CARDIAC MARKERS
CKMB, poc: <1 — ABNORMAL LOW
Operator id: 189501
Troponin i, poc: <0.05

## 2011-06-30 LAB — POTASSIUM: Potassium: 3.7

## 2011-06-30 LAB — URINALYSIS, ROUTINE W REFLEX MICROSCOPIC
Glucose, UA: NEGATIVE
Hgb urine dipstick: NEGATIVE
Ketones, ur: NEGATIVE
pH: 5

## 2011-06-30 LAB — CLOSTRIDIUM DIFFICILE EIA
C difficile Toxins A+B, EIA: NEGATIVE
C difficile Toxins A+B, EIA: NEGATIVE

## 2011-06-30 LAB — BASIC METABOLIC PANEL
BUN: 6
Calcium: 6.7 — ABNORMAL LOW
Creatinine, Ser: 0.94
GFR calc non Af Amer: 56 — ABNORMAL LOW
Glucose, Bld: 83

## 2011-06-30 LAB — CULTURE, BLOOD (ROUTINE X 2): Culture: NO GROWTH

## 2011-07-03 ENCOUNTER — Emergency Department (HOSPITAL_COMMUNITY)
Admission: EM | Admit: 2011-07-03 | Discharge: 2011-07-03 | Disposition: A | Discharge status: Home / Self Care | Payer: Medicare Other | Attending: Emergency Medicine | Admitting: Emergency Medicine

## 2011-07-03 ENCOUNTER — Emergency Department (HOSPITAL_COMMUNITY): Payer: Medicare Other

## 2011-07-03 DIAGNOSIS — W010XXA Fall on same level from slipping, tripping and stumbling without subsequent striking against object, initial encounter: Secondary | ICD-10-CM | POA: Insufficient documentation

## 2011-07-03 DIAGNOSIS — Y92009 Unspecified place in unspecified non-institutional (private) residence as the place of occurrence of the external cause: Secondary | ICD-10-CM | POA: Insufficient documentation

## 2011-07-03 DIAGNOSIS — S0083XA Contusion of other part of head, initial encounter: Secondary | ICD-10-CM | POA: Insufficient documentation

## 2011-07-03 DIAGNOSIS — S52599A Other fractures of lower end of unspecified radius, initial encounter for closed fracture: Secondary | ICD-10-CM | POA: Insufficient documentation

## 2011-07-03 DIAGNOSIS — IMO0002 Reserved for concepts with insufficient information to code with codable children: Secondary | ICD-10-CM | POA: Insufficient documentation

## 2011-07-03 DIAGNOSIS — M25439 Effusion, unspecified wrist: Secondary | ICD-10-CM | POA: Insufficient documentation

## 2011-07-03 DIAGNOSIS — M25539 Pain in unspecified wrist: Secondary | ICD-10-CM | POA: Insufficient documentation

## 2011-07-03 DIAGNOSIS — S0003XA Contusion of scalp, initial encounter: Secondary | ICD-10-CM | POA: Insufficient documentation

## 2011-07-03 DIAGNOSIS — I10 Essential (primary) hypertension: Secondary | ICD-10-CM | POA: Insufficient documentation

## 2011-07-03 DIAGNOSIS — Z79899 Other long term (current) drug therapy: Secondary | ICD-10-CM | POA: Insufficient documentation

## 2011-07-03 DIAGNOSIS — I509 Heart failure, unspecified: Secondary | ICD-10-CM | POA: Insufficient documentation

## 2011-07-04 NOTE — Op Note (Signed)
  NAME:  Debbie Spears, INLOW NO.:  [id number]  MEDICAL RECORD NO.:  [id number]  LOCATION:  [facility]                         FACILITY:  [facility]  PHYSICIAN:  Nadara Mustard, MD     DATE OF BIRTH:  [birth year]  DATE OF PROCEDURE:  07/03/2011 DATE OF DISCHARGE:  07/03/2011                              OPERATIVE REPORT   HISTORY OF PRESENT ILLNESS:  The patient is a 77 year old woman who states that she fell at home in the yard, tripped over a branch, fell on an outstretched left hand.  Denies any head injury, denies any syncopal events, denies any neck or head or back pain.  Fall occurred at home.  PAST MEDICAL HISTORY:  Significant for: 1. CHF. 2. Hypertension. 3. Pneumonia.  PAST SURGICAL HISTORY:  Positive for cholecystectomy.  SOCIAL HISTORY:  Does not smoke.  REVIEW OF SYSTEMS:  Negative.  PHYSICAL EXAMINATION:  VITAL SIGNS:  Blood pressure 193/61, pulse 74, respiratory rate 18, temperature 97.7. GENERAL:  She is in no acute distress. EXTREMITIES:  She has a dorsal angulation of the left wrist.  Her hand is neurovascularly intact.  She has good pulses.  There is ecchymosis and bruising around the wrist.  Radiograph shows a dorsally angulated metaphyseal left distal radius fracture.  ASSESSMENT:  Displaced dorsally angulated left distal radius Colles fracture.  PLAN:  After informed consent with the patient and her daughter, the patient underwent a hematoma block with 10 mL of 2% lidocaine plain. After adequate level of anesthesia obtained, the patient underwent closed reduction.  She tolerated this well.  She was placed in a sugar-tong splint.  The patient was given instructions for ice and elevation, nonweightbearing with the left upper extremity.  Followup at the end of the week. Prescription provided by ER staff for Vicodin for pain.  The patient will call for an appointment.  Discharged to home in stable condition.     Nadara Mustard,  MD     MVD/MEDQ  D:  07/03/2011  T:  07/04/2011  Job:  [id number]  Electronically Signed by Aldean Baker MD on 07/04/2011 05:58:01 PM

## 2011-07-26 ENCOUNTER — Other Ambulatory Visit: Payer: Self-pay | Admitting: Internal Medicine

## 2011-09-07 ENCOUNTER — Other Ambulatory Visit: Payer: Self-pay | Admitting: Internal Medicine

## 2011-09-07 MED ORDER — HYDRALAZINE HCL 25 MG PO TABS: ORAL_TABLET | ORAL | Status: DC

## 2011-11-14 IMAGING — CR DG WRIST COMPLETE 3+V*L*
4 series · 4 of 4 positions shown · non-contrast
Comparison: None

CLINICAL DATA: Fall, wrist pain

LEFT WRIST - COMPLETE 3+ VIEW

[x wrist lat left]
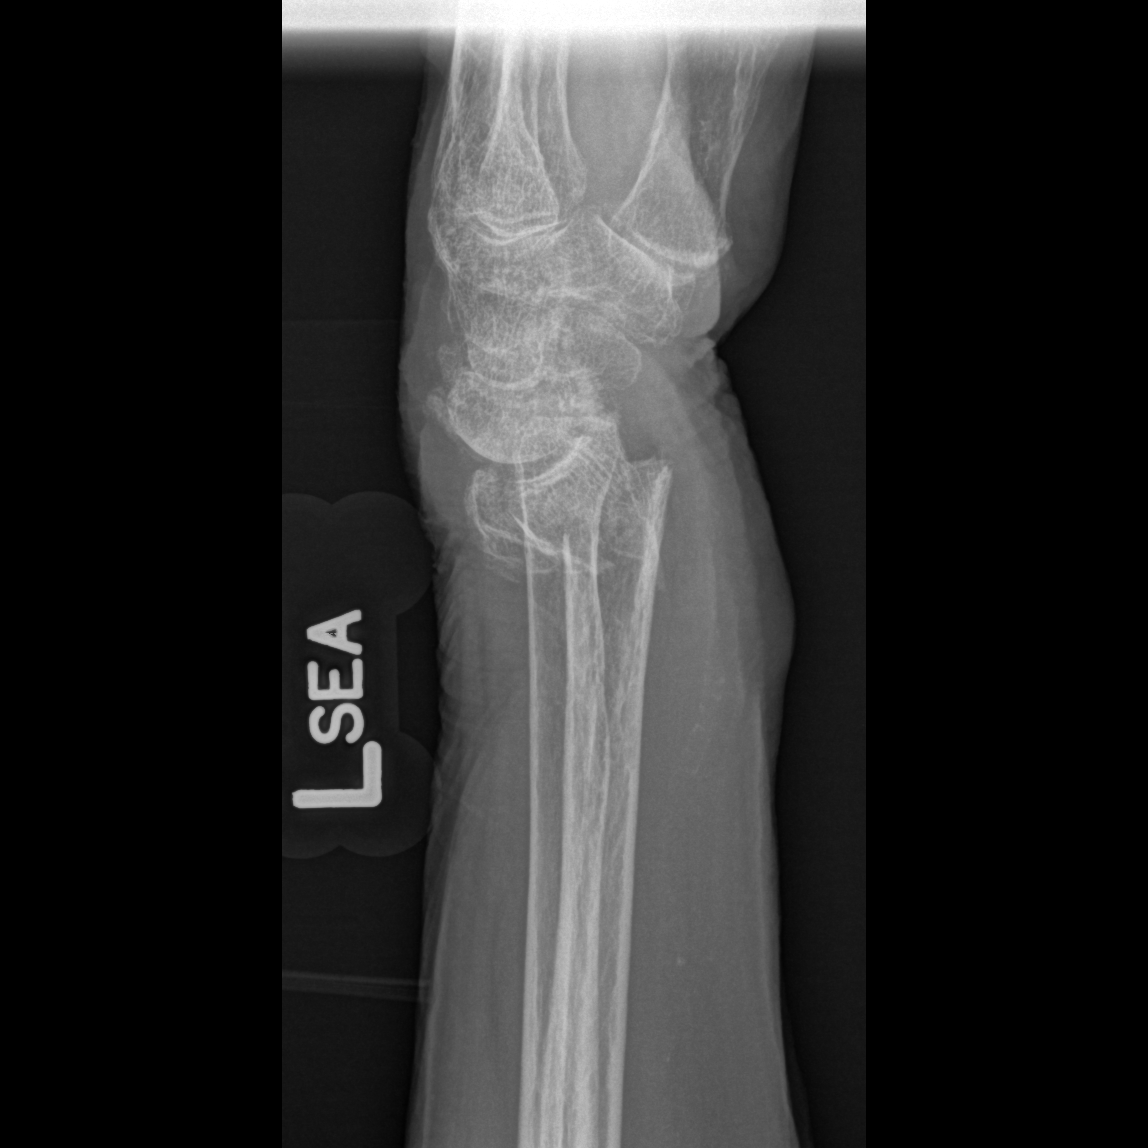

[x wrist obl left]
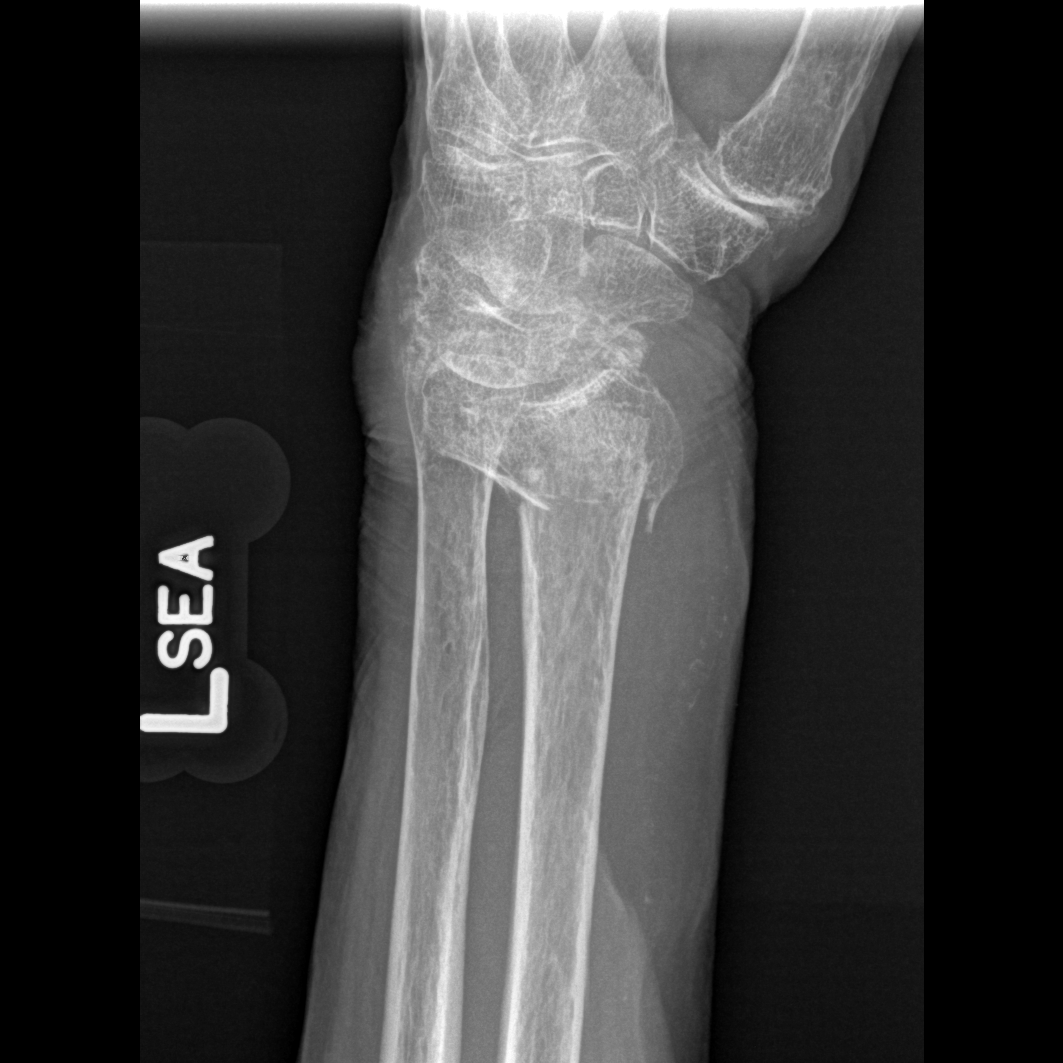

[x wrist pa left]
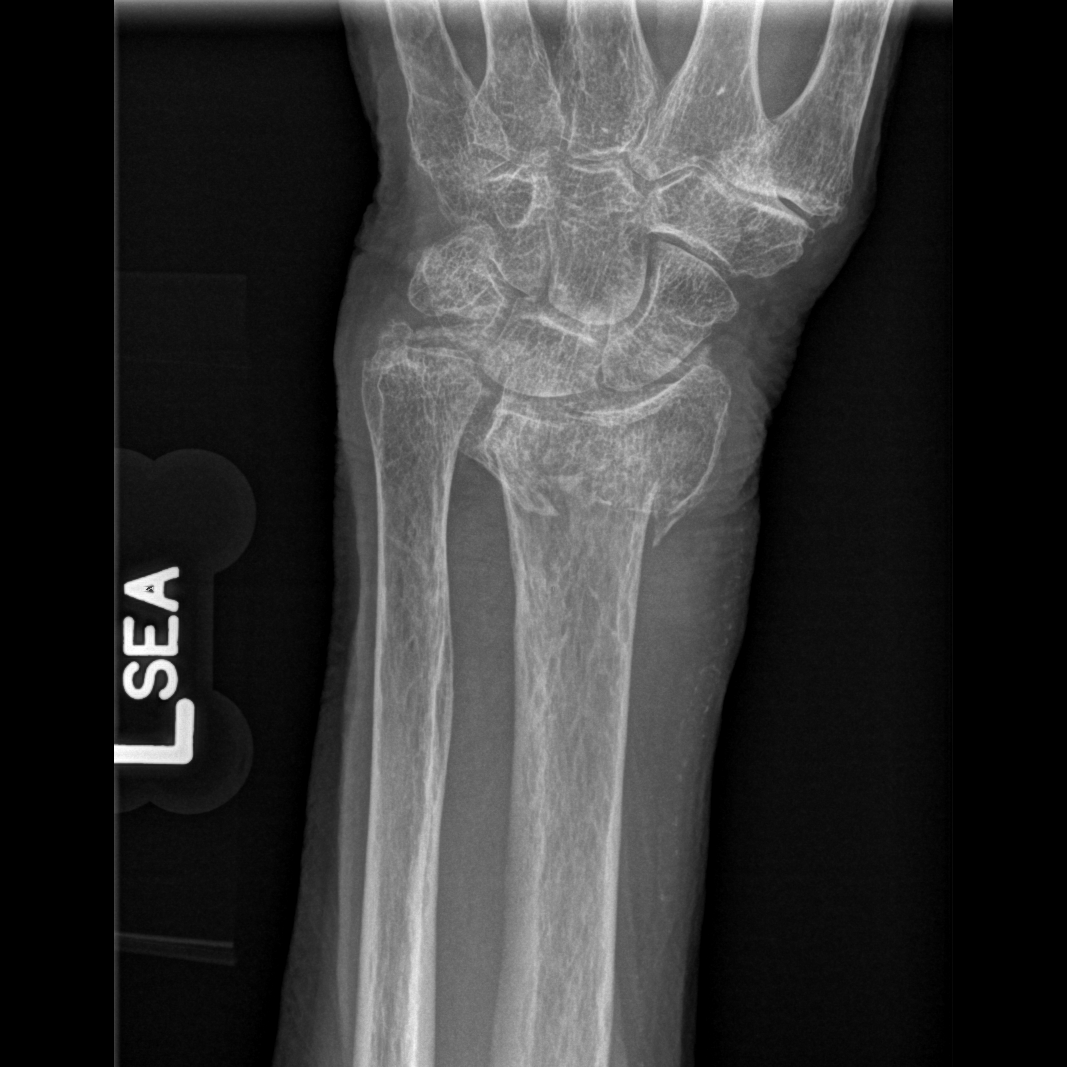

[x navicular]
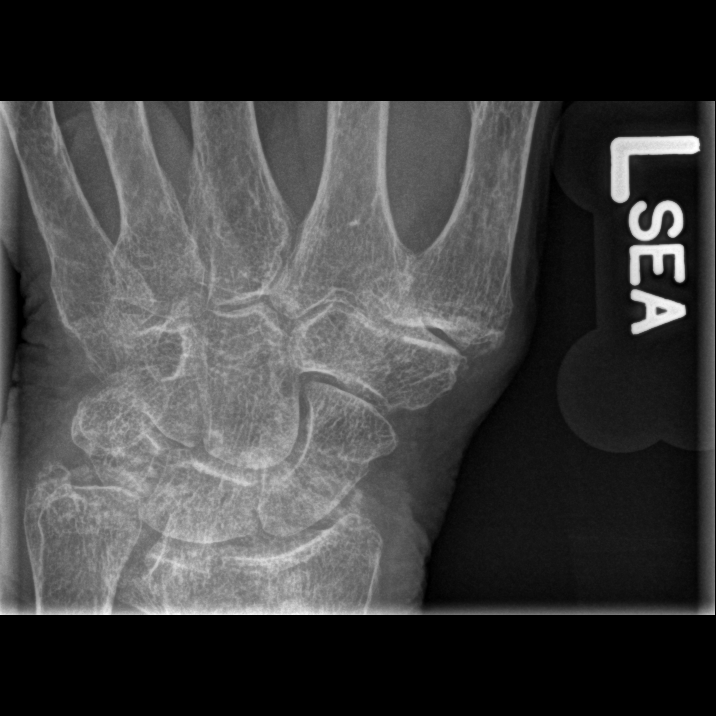

[4 of 4 positions shown; findings below may reference images not displayed]

FINDINGS: There is a fracture of the distal left radius with dorsal
angulation.  Radiocarpal joint is intact.  Fracture enters the
articular surface.
IMPRESSION: Fracture of the distal left radius.

## 2011-12-29 ENCOUNTER — Encounter (INDEPENDENT_AMBULATORY_CARE_PROVIDER_SITE_OTHER): Payer: Medicare Other | Admitting: Ophthalmology

## 2011-12-29 DIAGNOSIS — I10 Essential (primary) hypertension: Secondary | ICD-10-CM

## 2011-12-29 DIAGNOSIS — H43819 Vitreous degeneration, unspecified eye: Secondary | ICD-10-CM

## 2011-12-29 DIAGNOSIS — H35039 Hypertensive retinopathy, unspecified eye: Secondary | ICD-10-CM

## 2011-12-29 DIAGNOSIS — D3130 Benign neoplasm of unspecified choroid: Secondary | ICD-10-CM

## 2011-12-29 DIAGNOSIS — H33309 Unspecified retinal break, unspecified eye: Secondary | ICD-10-CM

## 2011-12-29 DIAGNOSIS — H2510 Age-related nuclear cataract, unspecified eye: Secondary | ICD-10-CM

## 2012-01-06 ENCOUNTER — Ambulatory Visit (INDEPENDENT_AMBULATORY_CARE_PROVIDER_SITE_OTHER): Payer: Medicare Other | Admitting: Ophthalmology

## 2012-01-06 DIAGNOSIS — H33309 Unspecified retinal break, unspecified eye: Secondary | ICD-10-CM

## 2012-01-20 ENCOUNTER — Other Ambulatory Visit: Payer: Self-pay

## 2012-01-20 MED ORDER — POTASSIUM CHLORIDE CRYS ER 20 MEQ PO TBCR: 20.0000 meq | EXTENDED_RELEASE_TABLET | Freq: Every day | ORAL | Status: DC

## 2012-01-20 MED ORDER — FUROSEMIDE 40 MG PO TABS: 40.0000 mg | ORAL_TABLET | Freq: Every day | ORAL | Status: DC

## 2012-01-20 MED ORDER — AMLODIPINE BESYLATE 5 MG PO TABS: 5.0000 mg | ORAL_TABLET | Freq: Every day | ORAL | Status: DC

## 2012-01-24 ENCOUNTER — Other Ambulatory Visit: Payer: Self-pay | Admitting: Internal Medicine

## 2012-01-24 ENCOUNTER — Other Ambulatory Visit: Payer: Self-pay

## 2012-01-24 MED ORDER — HYDRALAZINE HCL 25 MG PO TABS: ORAL_TABLET | ORAL | Status: DC

## 2012-01-24 MED ORDER — BENAZEPRIL HCL 20 MG PO TABS: 20.0000 mg | ORAL_TABLET | Freq: Every day | ORAL | Status: DC

## 2012-01-24 NOTE — Telephone Encounter (Signed)
..   Requested Prescriptions   Signed Prescriptions Disp Refills  . benazepril (LOTENSIN) 20 MG tablet 90 tablet 0    Sig: Take 1 tablet (20 mg total) by mouth daily.    Authorizing Provider: Pricilla Riffle    Ordering User: Christella Hartigan, Earlena Werst Judie Petit

## 2012-02-10 ENCOUNTER — Ambulatory Visit (INDEPENDENT_AMBULATORY_CARE_PROVIDER_SITE_OTHER): Payer: Medicare Other | Admitting: Internal Medicine

## 2012-02-10 VITALS — BP 173/64 | HR 64 | Ht 61.0 in | Wt 104.0 lb

## 2012-02-10 DIAGNOSIS — I4891 Unspecified atrial fibrillation: Secondary | ICD-10-CM

## 2012-02-10 NOTE — Patient Instructions (Signed)
Your physician wants you to follow-up in:  12 months.  You will receive a reminder letter in the mail two months in advance. If you don't receive a letter, please call our office to schedule the follow-up appointment.   

## 2012-02-10 NOTE — Progress Notes (Signed)
HPI Patient is a 78 year old with a history of diastolic CHF, HTN, HL.  I saw her in clinic 1 year ago.   Since seen she has done well from a cardiac standpoint.  Breathing is OK  No CP.  No signif edema.  No palpitations.  Appetite is good.  Sleeping is good. Allergies  Allergen Reactions  . Olmesartan Medoxomil     Current Outpatient Prescriptions  Medication Sig Dispense Refill  . amLODipine (NORVASC) 5 MG tablet Take 1 tablet (5 mg total) by mouth daily.  90 tablet  0  . aspirin EC 325 MG tablet Take 325 mg by mouth daily.        . benazepril (LOTENSIN) 20 MG tablet Take 1 tablet (20 mg total) by mouth daily.  90 tablet  0  . Calcium Carbonate Antacid (TUMS PO) Take by mouth as needed.        Tery Sanfilippo Sodium (COLACE PO) Take by mouth as needed.        . furosemide (LASIX) 40 MG tablet Take 1 tablet (40 mg total) by mouth daily.  30 tablet  2  . hydrALAZINE (APRESOLINE) 25 MG tablet Take 1 tablet every 6 hours  120 tablet  6  . Pediatric Multivitamins-Fl (MULTI VIT/FL PO) Take by mouth daily.        . potassium chloride SA (KLOR-CON M20) 20 MEQ tablet Take 1 tablet (20 mEq total) by mouth daily.  30 tablet  2    Past Medical History  Diagnosis Date  . Hypertension   . Hyperlipidemia   . Glucose intolerance (impaired glucose tolerance)   . Osteoporosis   . Osteoarthritis   . Atrial fibrillation   . Diastolic CHF, acute     Past Surgical History  Procedure Date  . Laparoscopic cholecystectomy March 02, 2007    Family History  Problem Relation Age of Onset  . Stroke Mother   . Heart attack Brother     History   Social History  . Marital Status: Widowed    Spouse Name: N/A    Number of Children: N/A  . Years of Education: N/A   Occupational History  . Not on file.   Social History Main Topics  . Smoking status: Never Smoker   . Smokeless tobacco: Not on file  . Alcohol Use: No  . Drug Use:   . Sexually Active:    Other Topics Concern  . Not on file    Social History Narrative   Widow and lives alone. Daughter Aurea Graff and others call or check on her daily.     Review of Systems:  All systems reviewed.  They are negative to the above problem except as previously stated.  Vital Signs: BP 173/64  Pulse 64  Ht 5\' 1"  (1.549 m)  Wt 104 lb (47.174 kg)  BMI 19.65 kg/m2  Physical Exam Patient is in NAD HEENT:  Normocephalic, atraumatic. EOMI, PERRLA.  Neck: JVP is normal. No thyromegaly. No bruits.  Lungs: clear to auscultation. No rales no wheezes.  Heart: Regular rate and rhythm. Normal S1, S2. No S3.   No significant murmurs. PMI not displaced.  Abdomen:  Supple, nontender. Normal bowel sounds. No masses. No hepatomegaly.  Extremities:   Good distal pulses throughout. No lower extremity edema.  Musculoskeletal :moving all extremities.  Neuro:   alert and oriented x3.  CN II-XII grossly intact.  EKG:  Atrial fibrillaton.  65 bpm.  LVH.  Nonspecific ST T wave changes. Assessment  and Plan:  1.  Diastolic CHF.  Volume status looks good.  I would not recomm changes  2.  HTN.  BP is labile  It is high today.  I am not eager to push lower as I do not want to risk hypotension.  3.  Atrial fibrillation.    Rate controlled.  Not a coumadin candidate.

## 2012-02-20 ENCOUNTER — Other Ambulatory Visit: Payer: Self-pay | Admitting: Internal Medicine

## 2012-05-07 ENCOUNTER — Ambulatory Visit (INDEPENDENT_AMBULATORY_CARE_PROVIDER_SITE_OTHER): Payer: Medicare Other | Admitting: Ophthalmology

## 2012-05-07 DIAGNOSIS — H33309 Unspecified retinal break, unspecified eye: Secondary | ICD-10-CM

## 2012-05-07 DIAGNOSIS — H43819 Vitreous degeneration, unspecified eye: Secondary | ICD-10-CM

## 2012-05-07 DIAGNOSIS — D3130 Benign neoplasm of unspecified choroid: Secondary | ICD-10-CM

## 2012-09-01 ENCOUNTER — Other Ambulatory Visit: Payer: Self-pay | Admitting: Internal Medicine

## 2012-09-12 DIAGNOSIS — I639 Cerebral infarction, unspecified: Secondary | ICD-10-CM

## 2012-09-12 HISTORY — DX: Cerebral infarction, unspecified: I63.9

## 2012-09-13 ENCOUNTER — Other Ambulatory Visit: Payer: Self-pay | Admitting: Internal Medicine

## 2012-10-19 ENCOUNTER — Encounter (HOSPITAL_COMMUNITY): Payer: Self-pay

## 2012-10-19 ENCOUNTER — Emergency Department (HOSPITAL_COMMUNITY): Payer: Medicare Other

## 2012-10-19 ENCOUNTER — Emergency Department (HOSPITAL_COMMUNITY)
Admission: EM | Admit: 2012-10-19 | Discharge: 2012-10-19 | Disposition: A | Discharge status: Home / Self Care | Payer: Medicare Other | Attending: Emergency Medicine | Admitting: Emergency Medicine

## 2012-10-19 DIAGNOSIS — S7000XA Contusion of unspecified hip, initial encounter: Secondary | ICD-10-CM | POA: Insufficient documentation

## 2012-10-19 DIAGNOSIS — Y929 Unspecified place or not applicable: Secondary | ICD-10-CM | POA: Insufficient documentation

## 2012-10-19 DIAGNOSIS — I5031 Acute diastolic (congestive) heart failure: Secondary | ICD-10-CM | POA: Insufficient documentation

## 2012-10-19 DIAGNOSIS — Z8739 Personal history of other diseases of the musculoskeletal system and connective tissue: Secondary | ICD-10-CM | POA: Insufficient documentation

## 2012-10-19 DIAGNOSIS — W07XXXA Fall from chair, initial encounter: Secondary | ICD-10-CM | POA: Insufficient documentation

## 2012-10-19 DIAGNOSIS — Y939 Activity, unspecified: Secondary | ICD-10-CM | POA: Insufficient documentation

## 2012-10-19 DIAGNOSIS — Z7982 Long term (current) use of aspirin: Secondary | ICD-10-CM | POA: Insufficient documentation

## 2012-10-19 DIAGNOSIS — Z8639 Personal history of other endocrine, nutritional and metabolic disease: Secondary | ICD-10-CM | POA: Insufficient documentation

## 2012-10-19 DIAGNOSIS — M1990 Unspecified osteoarthritis, unspecified site: Secondary | ICD-10-CM | POA: Insufficient documentation

## 2012-10-19 DIAGNOSIS — Z79899 Other long term (current) drug therapy: Secondary | ICD-10-CM | POA: Insufficient documentation

## 2012-10-19 DIAGNOSIS — M810 Age-related osteoporosis without current pathological fracture: Secondary | ICD-10-CM | POA: Insufficient documentation

## 2012-10-19 DIAGNOSIS — Z8679 Personal history of other diseases of the circulatory system: Secondary | ICD-10-CM | POA: Insufficient documentation

## 2012-10-19 DIAGNOSIS — Z862 Personal history of diseases of the blood and blood-forming organs and certain disorders involving the immune mechanism: Secondary | ICD-10-CM | POA: Insufficient documentation

## 2012-10-19 DIAGNOSIS — S7001XA Contusion of right hip, initial encounter: Secondary | ICD-10-CM

## 2012-10-19 DIAGNOSIS — I10 Essential (primary) hypertension: Secondary | ICD-10-CM | POA: Insufficient documentation

## 2012-10-19 NOTE — ED Notes (Signed)
Pt ambulated with minimal assist, tolerated well. MD at bedside.

## 2012-10-19 NOTE — ED Notes (Signed)
Patient transported to X-ray 

## 2012-10-19 NOTE — ED Notes (Signed)
Pt fell off her chair and hit her right hip on the floor, pain when standing.

## 2012-10-19 NOTE — ED Provider Notes (Signed)
History     CSN: [id number]  Arrival date & time 10/19/12  1212   First MD Initiated Contact with Patient 10/19/12 1215      Chief Complaint  Patient presents with  . Fall  . Hip Pain    (Consider location/radiation/quality/duration/timing/severity/associated sxs/prior treatment) HPI Pt reports she stumbled getting out of a chair and landed on her R hip. Complaining of moderate to severe R hip pain worse with movement and standing and relieved with rest. No head injury. Brought from home by EMS.  Past Medical History  Diagnosis Date  . Hypertension   . Hyperlipidemia   . Glucose intolerance (impaired glucose tolerance)   . Osteoporosis   . Osteoarthritis   . Atrial fibrillation   . Diastolic CHF, acute     Past Surgical History  Procedure Date  . Laparoscopic cholecystectomy March 02, 2007    Family History  Problem Relation Age of Onset  . Stroke Mother   . Heart attack Brother     History  Substance Use Topics  . Smoking status: Never Smoker   . Smokeless tobacco: Not on file  . Alcohol Use: No    OB History    Grav Para Term Preterm Abortions TAB SAB Ect Mult Living                  Review of Systems All other systems reviewed and are negative except as noted in HPI.   Allergies  Olmesartan medoxomil  Home Medications   Current Outpatient Rx  Name  Route  Sig  Dispense  Refill  . AMLODIPINE BESYLATE 5 MG PO TABS   Oral   Take 1 tablet (5 mg total) by mouth daily.   90 tablet   0   . ASPIRIN EC 325 MG PO TBEC   Oral   Take 325 mg by mouth daily.           Marland Kitchen BENAZEPRIL HCL 20 MG PO TABS      TAKE 2 TABLETS EVERY MORNING AND 1 TABLET AT NIGHT   90 tablet   6   . CALCIUM PO   Oral   Take 1 tablet by mouth daily.         Marland Kitchen COLACE PO   Oral   Take 1 capsule by mouth 2 (two) times daily as needed. For constipation         . FUROSEMIDE 40 MG PO TABS   Oral   Take 1 tablet (40 mg total) by mouth daily.   30 tablet   2   .  HYDRALAZINE HCL 25 MG PO TABS      TAKE 1 TABLET BY MOUTH EVERY 6 HOURS   120 tablet   6   . POTASSIUM CHLORIDE CRYS ER 20 MEQ PO TBCR   Oral   Take 1 tablet (20 mEq total) by mouth daily.   30 tablet   2     BP 197/49  Pulse 66  Temp 97.7 F (36.5 C) (Oral)  Resp 18  Ht 5\' 2"  (1.575 m)  Wt 105 lb (47.628 kg)  BMI 19.20 kg/m2  SpO2 97%  Physical Exam  Nursing note and vitals reviewed. Constitutional: She is oriented to person, place, and time. She appears well-developed and well-nourished.  HENT:  Head: Normocephalic and atraumatic.  Eyes: EOM are normal. Pupils are equal, round, and reactive to light.  Neck: Normal range of motion. Neck supple.  Cardiovascular: Normal rate, normal heart sounds and intact  distal pulses.   Pulmonary/Chest: Effort normal and breath sounds normal.  Abdominal: Bowel sounds are normal. She exhibits no distension. There is no tenderness.  Musculoskeletal: Normal range of motion. She exhibits tenderness. She exhibits no edema.       Tender to palpation of R hip, but no external rotation or foreshortening  Neurological: She is alert and oriented to person, place, and time. She has normal strength. No cranial nerve deficit or sensory deficit.  Skin: Skin is warm and dry. No rash noted.  Psychiatric: She has a normal mood and affect.    ED Course  Procedures (including critical care time)  Labs Reviewed - No data to display Dg Hip Complete Right  10/19/2012  *RADIOLOGY REPORT*  Clinical Data: Right hip pain secondary to a fall.  RIGHT HIP - COMPLETE 2+ VIEW  Comparison: None.  Findings: There is diffuse osteopenia.  There is no fracture or dislocation or significant arthritis of the right hip.  There is diffuse osteopenia of the other pelvic bones.  IMPRESSION: No acute abnormality.  Osteopenia.   Original Report Authenticated By: Francene Boyers, M.D.      No diagnosis found.    MDM  Hip pain after fall but does not have typical appearance  of hip fracture. Pt declines pain medication. Will check xray but hold off on other Hip Fx protocols until diagnosis is confirmed.   2:22 PM Xray neg as above. Pt able to stand and ambulated without difficulty. Continues to decline pain meds. Wants to take APAP at home. Daughter at the bedside is comfortable taking her home.    Gustavo Dispenza B. Bernette Mayers, MD 10/19/12 1422

## 2013-04-03 ENCOUNTER — Other Ambulatory Visit: Payer: Self-pay | Admitting: Internal Medicine

## 2013-04-15 ENCOUNTER — Other Ambulatory Visit: Payer: Self-pay

## 2013-04-15 ENCOUNTER — Other Ambulatory Visit: Payer: Self-pay | Admitting: Internal Medicine

## 2013-04-15 MED ORDER — POTASSIUM CHLORIDE CRYS ER 20 MEQ PO TBCR: 20.0000 meq | EXTENDED_RELEASE_TABLET | Freq: Every day | ORAL | Status: DC

## 2013-04-25 ENCOUNTER — Inpatient Hospital Stay (HOSPITAL_COMMUNITY)
Admission: EM | Admit: 2013-04-25 | Discharge: 2013-04-29 | DRG: 064 | Disposition: A | Discharge status: Skilled Nursing Facility | Payer: Medicare Other | Attending: Internal Medicine | Admitting: Internal Medicine

## 2013-04-25 ENCOUNTER — Encounter (HOSPITAL_COMMUNITY): Payer: Self-pay

## 2013-04-25 ENCOUNTER — Observation Stay (HOSPITAL_COMMUNITY): Payer: Medicare Other

## 2013-04-25 ENCOUNTER — Emergency Department (HOSPITAL_COMMUNITY): Payer: Medicare Other

## 2013-04-25 DIAGNOSIS — Z66 Do not resuscitate: Secondary | ICD-10-CM | POA: Diagnosis present

## 2013-04-25 DIAGNOSIS — I5033 Acute on chronic diastolic (congestive) heart failure: Secondary | ICD-10-CM

## 2013-04-25 DIAGNOSIS — N183 Chronic kidney disease, stage 3 unspecified: Secondary | ICD-10-CM | POA: Diagnosis present

## 2013-04-25 DIAGNOSIS — Z8673 Personal history of transient ischemic attack (TIA), and cerebral infarction without residual deficits: Secondary | ICD-10-CM

## 2013-04-25 DIAGNOSIS — E739 Lactose intolerance, unspecified: Secondary | ICD-10-CM

## 2013-04-25 DIAGNOSIS — I509 Heart failure, unspecified: Secondary | ICD-10-CM | POA: Diagnosis present

## 2013-04-25 DIAGNOSIS — M810 Age-related osteoporosis without current pathological fracture: Secondary | ICD-10-CM

## 2013-04-25 DIAGNOSIS — N179 Acute kidney failure, unspecified: Secondary | ICD-10-CM

## 2013-04-25 DIAGNOSIS — I639 Cerebral infarction, unspecified: Secondary | ICD-10-CM

## 2013-04-25 DIAGNOSIS — I4891 Unspecified atrial fibrillation: Secondary | ICD-10-CM

## 2013-04-25 DIAGNOSIS — M1990 Unspecified osteoarthritis, unspecified site: Secondary | ICD-10-CM

## 2013-04-25 DIAGNOSIS — R609 Edema, unspecified: Secondary | ICD-10-CM

## 2013-04-25 DIAGNOSIS — Z7982 Long term (current) use of aspirin: Secondary | ICD-10-CM

## 2013-04-25 DIAGNOSIS — E785 Hyperlipidemia, unspecified: Secondary | ICD-10-CM

## 2013-04-25 DIAGNOSIS — I63219 Cerebral infarction due to unspecified occlusion or stenosis of unspecified vertebral arteries: Principal | ICD-10-CM | POA: Diagnosis present

## 2013-04-25 DIAGNOSIS — I129 Hypertensive chronic kidney disease with stage 1 through stage 4 chronic kidney disease, or unspecified chronic kidney disease: Secondary | ICD-10-CM | POA: Diagnosis present

## 2013-04-25 DIAGNOSIS — I10 Essential (primary) hypertension: Secondary | ICD-10-CM

## 2013-04-25 DIAGNOSIS — Z79899 Other long term (current) drug therapy: Secondary | ICD-10-CM

## 2013-04-25 DIAGNOSIS — R42 Dizziness and giddiness: Secondary | ICD-10-CM

## 2013-04-25 DIAGNOSIS — E860 Dehydration: Secondary | ICD-10-CM

## 2013-04-25 DIAGNOSIS — R110 Nausea: Secondary | ICD-10-CM

## 2013-04-25 LAB — CBC WITH DIFFERENTIAL/PLATELET
Basophils Absolute: 0 10*3/uL (ref 0.0–0.1)
Basophils Relative: 0 % (ref 0–1)
HCT: 40.8 % (ref 36.0–46.0)
Lymphocytes Relative: 4 % — ABNORMAL LOW (ref 12–46)
MCHC: 34.6 g/dL (ref 30.0–36.0)
Monocytes Absolute: 0.3 10*3/uL (ref 0.1–1.0)
Neutro Abs: 7.3 10*3/uL (ref 1.7–7.7)
Neutrophils Relative %: 92 % — ABNORMAL HIGH (ref 43–77)
Platelets: 142 10*3/uL — ABNORMAL LOW (ref 150–400)
RDW: 14.4 % (ref 11.5–15.5)
WBC: 7.9 10*3/uL (ref 4.0–10.5)

## 2013-04-25 LAB — CBC
HCT: 41.2 % (ref 36.0–46.0)
MCH: 32.4 pg (ref 26.0–34.0)
MCHC: 33.7 g/dL (ref 30.0–36.0)
MCV: 96 fL (ref 78.0–100.0)
RDW: 14.3 % (ref 11.5–15.5)

## 2013-04-25 LAB — COMPREHENSIVE METABOLIC PANEL
ALT: 15 U/L (ref 0–35)
AST: 32 U/L (ref 0–37)
Albumin: 3.7 g/dL (ref 3.5–5.2)
Alkaline Phosphatase: 79 U/L (ref 39–117)
CO2: 29 mEq/L (ref 19–32)
Chloride: 99 mEq/L (ref 96–112)
Creatinine, Ser: 1.4 mg/dL — ABNORMAL HIGH (ref 0.50–1.10)
GFR calc non Af Amer: 30 mL/min — ABNORMAL LOW (ref >90)
Potassium: 4.3 mEq/L (ref 3.5–5.1)
Sodium: 140 mEq/L (ref 135–145)
Total Bilirubin: 0.3 mg/dL (ref 0.3–1.2)

## 2013-04-25 LAB — URINALYSIS, ROUTINE W REFLEX MICROSCOPIC
Bilirubin Urine: NEGATIVE
Hgb urine dipstick: NEGATIVE
Ketones, ur: NEGATIVE mg/dL
Protein, ur: NEGATIVE mg/dL
Urobilinogen, UA: 0.2 mg/dL (ref 0.0–1.0)

## 2013-04-25 LAB — POCT I-STAT TROPONIN I: Troponin i, poc: 0.01 ng/mL (ref 0.00–0.08)

## 2013-04-25 MED ORDER — ALUM & MAG HYDROXIDE-SIMETH 200-200-20 MG/5ML PO SUSP: 30.0000 mL | Freq: Four times a day (QID) | ORAL | Status: DC | PRN

## 2013-04-25 MED ORDER — ACETAMINOPHEN 650 MG RE SUPP: 650.0000 mg | Freq: Four times a day (QID) | RECTAL | Status: DC | PRN

## 2013-04-25 MED ORDER — ONDANSETRON HCL 4 MG/2ML IJ SOLN
4.0000 mg | Freq: Four times a day (QID) | INTRAMUSCULAR | Status: DC | PRN
  Administered 2013-04-26: 4 mg via INTRAVENOUS
  Filled 2013-04-25: qty 2

## 2013-04-25 MED ORDER — ONDANSETRON HCL 4 MG PO TABS: 4.0000 mg | ORAL_TABLET | Freq: Four times a day (QID) | ORAL | Status: DC | PRN

## 2013-04-25 MED ORDER — AMLODIPINE BESYLATE 5 MG PO TABS
5.0000 mg | ORAL_TABLET | Freq: Every day | ORAL | Status: DC
  Administered 2013-04-26 – 2013-04-28 (×3): 5 mg via ORAL
  Filled 2013-04-25 (×4): qty 1

## 2013-04-25 MED ORDER — SODIUM CHLORIDE 0.9 % IJ SOLN
3.0000 mL | Freq: Two times a day (BID) | INTRAMUSCULAR | Status: DC
  Administered 2013-04-26 – 2013-04-29 (×8): 3 mL via INTRAVENOUS

## 2013-04-25 MED ORDER — HYDRALAZINE HCL 25 MG PO TABS
25.0000 mg | ORAL_TABLET | Freq: Three times a day (TID) | ORAL | Status: DC
  Administered 2013-04-25 – 2013-04-29 (×16): 25 mg via ORAL
  Filled 2013-04-25 (×18): qty 1

## 2013-04-25 MED ORDER — MECLIZINE HCL 12.5 MG PO TABS
12.5000 mg | ORAL_TABLET | Freq: Two times a day (BID) | ORAL | Status: DC
  Administered 2013-04-26 – 2013-04-29 (×8): 12.5 mg via ORAL
  Filled 2013-04-25 (×9): qty 1

## 2013-04-25 MED ORDER — ASPIRIN EC 325 MG PO TBEC
325.0000 mg | DELAYED_RELEASE_TABLET | Freq: Every day | ORAL | Status: DC
  Administered 2013-04-26: 325 mg via ORAL
  Filled 2013-04-25 (×2): qty 1

## 2013-04-25 MED ORDER — ADULT MULTIVITAMIN W/MINERALS CH
1.0000 | ORAL_TABLET | Freq: Every day | ORAL | Status: DC
  Administered 2013-04-26 – 2013-04-29 (×4): 1 via ORAL
  Filled 2013-04-25 (×4): qty 1

## 2013-04-25 MED ORDER — ONDANSETRON HCL 4 MG/2ML IJ SOLN
4.0000 mg | Freq: Once | INTRAMUSCULAR | Status: AC
  Administered 2013-04-25: 4 mg via INTRAVENOUS
  Filled 2013-04-25: qty 2

## 2013-04-25 MED ORDER — ENOXAPARIN SODIUM 30 MG/0.3ML ~~LOC~~ SOLN
30.0000 mg | SUBCUTANEOUS | Status: DC
  Administered 2013-04-25 – 2013-04-28 (×4): 30 mg via SUBCUTANEOUS
  Filled 2013-04-25 (×5): qty 0.3

## 2013-04-25 MED ORDER — HYDROMORPHONE HCL PF 1 MG/ML IJ SOLN: 1.0000 mg | INTRAMUSCULAR | Status: DC | PRN

## 2013-04-25 MED ORDER — HYDROCODONE-ACETAMINOPHEN 5-325 MG PO TABS: 1.0000 | ORAL_TABLET | Freq: Four times a day (QID) | ORAL | Status: DC | PRN

## 2013-04-25 MED ORDER — SODIUM CHLORIDE 0.9 % IV SOLN
INTRAVENOUS | Status: DC
  Administered 2013-04-25: 19:00:00 via INTRAVENOUS

## 2013-04-25 MED ORDER — ACETAMINOPHEN 325 MG PO TABS
650.0000 mg | ORAL_TABLET | Freq: Four times a day (QID) | ORAL | Status: DC | PRN
  Administered 2013-04-25 – 2013-04-28 (×4): 650 mg via ORAL
  Filled 2013-04-25 (×4): qty 2

## 2013-04-25 NOTE — ED Provider Notes (Signed)
CSN: [id number]     Arrival date & time 04/25/13  1142 History     First MD Initiated Contact with Patient 04/25/13 1200     No chief complaint on file.  (Consider location/radiation/quality/duration/timing/severity/associated sxs/prior Treatment) HPI Comments: Patient is 79 year old female who presents with acute onset of dizziness with bending down and turning her head.  Her daughter states that she has been complaining of "upset stomach" for the past several days.  She reports previous history of vertigo with similar results.  Denies vomiting but states that she is dry heaving.  Denies headache, blurred vision, numbness tingling, focal weakness.  Also denies chest pain or shortness of breath or abdominal pain.  Patient is a 79 y.o. female presenting with neurologic complaint. The history is provided by the patient and a relative. No language interpreter was used.  Neurologic Problem This is a new problem. The current episode started today. The problem occurs constantly. The problem has been unchanged. Associated symptoms include nausea. Pertinent negatives include no abdominal pain, anorexia, chest pain, congestion, coughing, diaphoresis, fatigue, headaches, myalgias, neck pain, numbness, rash, swollen glands, urinary symptoms, vertigo, visual change, vomiting or weakness. The symptoms are aggravated by bending (moving head). She has tried nothing for the symptoms. The treatment provided no relief.    Past Medical History  Diagnosis Date  . Hypertension   . Hyperlipidemia   . Glucose intolerance (impaired glucose tolerance)   . Osteoporosis   . Osteoarthritis   . Atrial fibrillation   . Diastolic CHF, acute    Past Surgical History  Procedure Laterality Date  . Laparoscopic cholecystectomy  March 02, 2007   Family History  Problem Relation Age of Onset  . Stroke Mother   . Heart attack Brother    History  Substance Use Topics  . Smoking status: Never Smoker   . Smokeless  tobacco: Not on file  . Alcohol Use: No   OB History   Grav Para Term Preterm Abortions TAB SAB Ect Mult Living                 Review of Systems  Constitutional: Negative for diaphoresis and fatigue.  HENT: Negative for ear pain, congestion and neck pain.   Eyes: Negative for pain and visual disturbance.  Respiratory: Negative for cough.   Cardiovascular: Negative for chest pain.  Gastrointestinal: Positive for nausea. Negative for vomiting, abdominal pain and anorexia.  Genitourinary: Negative for dysuria.  Musculoskeletal: Negative for myalgias.  Skin: Negative for rash.  Neurological: Positive for dizziness and light-headedness. Negative for vertigo, syncope, weakness, numbness and headaches.  All other systems reviewed and are negative.    Allergies  Olmesartan medoxomil  Home Medications   Current Outpatient Rx  Name  Route  Sig  Dispense  Refill  . amLODipine (NORVASC) 5 MG tablet   Oral   Take 1 tablet (5 mg total) by mouth daily.   90 tablet   0   . amLODipine (NORVASC) 5 MG tablet      TAKE ONE TABLET BY MOUTH DAILY   30 tablet   2     .Marland KitchenPatient needs to contact office to schedule a ye ...   . aspirin EC 325 MG tablet   Oral   Take 325 mg by mouth daily.           . benazepril (LOTENSIN) 20 MG tablet      TAKE 2 TABLETS EVERY MORNING AND 1 TABLET AT NIGHT   90 tablet  6   . CALCIUM PO   Oral   Take 1 tablet by mouth daily.         Tery Sanfilippo Sodium (COLACE PO)   Oral   Take 1 capsule by mouth 2 (two) times daily as needed. For constipation         . furosemide (LASIX) 40 MG tablet   Oral   Take 1 tablet (40 mg total) by mouth daily.   30 tablet   2   . furosemide (LASIX) 40 MG tablet      TAKE 1 TABLET BY MOUTH EVERY DAY   30 tablet   3     .Patient needs to contact office to schedule a yea ...   . hydrALAZINE (APRESOLINE) 25 MG tablet      TAKE 1 TABLET BY MOUTH EVERY 6 HOURS   120 tablet   6     Patient needs to  schedule follow up.   . potassium chloride SA (KLOR-CON M20) 20 MEQ tablet   Oral   Take 1 tablet (20 mEq total) by mouth daily.   30 tablet   2     .Marland KitchenPatient needs to contact office to schedule  App ...   . potassium chloride SA (KLOR-CON M20) 20 MEQ tablet      TAKE ONE TABLET BY MOUTH DAILY   30 tablet   6    BP 143/46  Pulse 70  Temp(Src) 97.4 F (36.3 C) (Oral)  Resp 19  SpO2 93% Physical Exam  Nursing note and vitals reviewed. Constitutional: She is oriented to person, place, and time. She appears well-developed and well-nourished. No distress.  HENT:  Head: Normocephalic and atraumatic.  Right Ear: External ear normal.  Left Ear: External ear normal.  Nose: Nose normal.  Mouth/Throat: Oropharynx is clear and moist. No oropharyngeal exudate.  No nystagmus noted but patient reports dizziness with turning head toward the right.  Eyes: Conjunctivae are normal. Pupils are equal, round, and reactive to light. Right eye exhibits no discharge. Left eye exhibits no discharge. Right eye exhibits normal extraocular motion and no nystagmus. Left eye exhibits normal extraocular motion and no nystagmus.  Neck: Normal range of motion. Neck supple.  Cardiovascular: Normal rate, regular rhythm and normal heart sounds.  Exam reveals no gallop and no friction rub.   No murmur heard. Pulmonary/Chest: Effort normal and breath sounds normal. No respiratory distress. She has no wheezes. She has no rales. She exhibits no tenderness.  Abdominal: Soft. Bowel sounds are normal. She exhibits no distension. There is no tenderness. There is no rebound and no guarding.  Musculoskeletal: Normal range of motion. She exhibits no edema and no tenderness.  Lymphadenopathy:    She has no cervical adenopathy.  Neurological: She is alert and oriented to person, place, and time. No cranial nerve deficit. She exhibits normal muscle tone. Coordination normal.  Skin: Skin is warm and dry. No rash noted. No  erythema. No pallor.  Psychiatric: She has a normal mood and affect. Her behavior is normal.    ED Course   Procedures (including critical care time)  Labs Reviewed  CBC WITH DIFFERENTIAL  COMPREHENSIVE METABOLIC PANEL  URINALYSIS, ROUTINE W REFLEX MICROSCOPIC   Ct Head Wo Contrast  04/25/2013   *RADIOLOGY REPORT*  Clinical Data: Dizziness.  Nausea and vomiting  CT HEAD WITHOUT CONTRAST  Technique:  Contiguous axial images were obtained from the base of the skull through the vertex without contrast.  Comparison: None.  Findings: Generalized  atrophy.  Chronic right parietal cortical infarct.  Mild chronic microvascular ischemic change.  Small chronic infarct left cerebellum.  Negative for acute infarct, hemorrhage, or mass lesion.  Negative for skull lesion.  IMPRESSION: Chronic right parietal infarct.  Small chronic infarct left cerebellum.  No acute abnormality.   Original Report Authenticated By: Janeece Riggers, M.D.   No diagnosis found.  Results for orders placed during the hospital encounter of 04/25/13  CBC WITH DIFFERENTIAL      Result Value Range   WBC 7.9  4.0 - 10.5 K/uL   RBC 4.26  3.87 - 5.11 MIL/uL   Hemoglobin 14.1  12.0 - 15.0 g/dL   HCT 78.2  95.6 - 21.3 %   MCV 95.8  78.0 - 100.0 fL   MCH 33.1  26.0 - 34.0 pg   MCHC 34.6  30.0 - 36.0 g/dL   RDW 08.6  57.8 - 46.9 %   Platelets 142 (*) 150 - 400 K/uL   Neutrophils Relative % 92 (*) 43 - 77 %   Neutro Abs 7.3  1.7 - 7.7 K/uL   Lymphocytes Relative 4 (*) 12 - 46 %   Lymphs Abs 0.3 (*) 0.7 - 4.0 K/uL   Monocytes Relative 4  3 - 12 %   Monocytes Absolute 0.3  0.1 - 1.0 K/uL   Eosinophils Relative 0  0 - 5 %   Eosinophils Absolute 0.0  0.0 - 0.7 K/uL   Basophils Relative 0  0 - 1 %   Basophils Absolute 0.0  0.0 - 0.1 K/uL  COMPREHENSIVE METABOLIC PANEL      Result Value Range   Sodium 140  135 - 145 mEq/L   Potassium 4.3  3.5 - 5.1 mEq/L   Chloride 99  96 - 112 mEq/L   CO2 29  19 - 32 mEq/L   Glucose, Bld 174 (*)  70 - 99 mg/dL   BUN 28 (*) 6 - 23 mg/dL   Creatinine, Ser 6.29 (*) 0.50 - 1.10 mg/dL   Calcium 9.7  8.4 - 52.8 mg/dL   Total Protein 6.5  6.0 - 8.3 g/dL   Albumin 3.7  3.5 - 5.2 g/dL   AST 32  0 - 37 U/L   ALT 15  0 - 35 U/L   Alkaline Phosphatase 79  39 - 117 U/L   Total Bilirubin 0.3  0.3 - 1.2 mg/dL   GFR calc non Af Amer 30 (*) >90 mL/min   GFR calc Af Amer 35 (*) >90 mL/min  URINALYSIS, ROUTINE W REFLEX MICROSCOPIC      Result Value Range   Color, Urine YELLOW  YELLOW   APPearance CLEAR  CLEAR   Specific Gravity, Urine 1.007  1.005 - 1.030   pH 7.0  5.0 - 8.0   Glucose, UA NEGATIVE  NEGATIVE mg/dL   Hgb urine dipstick NEGATIVE  NEGATIVE   Bilirubin Urine NEGATIVE  NEGATIVE   Ketones, ur NEGATIVE  NEGATIVE mg/dL   Protein, ur NEGATIVE  NEGATIVE mg/dL   Urobilinogen, UA 0.2  0.0 - 1.0 mg/dL   Nitrite NEGATIVE  NEGATIVE   Leukocytes, UA NEGATIVE  NEGATIVE   Ct Head Wo Contrast  04/25/2013   *RADIOLOGY REPORT*  Clinical Data: Dizziness.  Nausea and vomiting  CT HEAD WITHOUT CONTRAST  Technique:  Contiguous axial images were obtained from the base of the skull through the vertex without contrast.  Comparison: None.  Findings: Generalized atrophy.  Chronic right parietal cortical infarct.  Mild chronic  microvascular ischemic change.  Small chronic infarct left cerebellum.  Negative for acute infarct, hemorrhage, or mass lesion.  Negative for skull lesion.  IMPRESSION: Chronic right parietal infarct.  Small chronic infarct left cerebellum.  No acute abnormality.   Original Report Authenticated By: Janeece Riggers, M.D.    EKG: ekg findings:atrial fibrillation, as seen on previous EKG's, prolonged QT, left anterior fasicular block - EKG reviewed by Dr. Rubin Payor  MDM  Patient with history of chronic a fib and HTN presents today with more like vertiginous type symptoms of dizziness, cannot rule out central cause to this and with the patient's advanced age will admit.  [facility] called and will  admit, patient seen with Dr. Rubin Payor.  Izola Price Marisue Humble, PA-C 04/25/13 1539

## 2013-04-25 NOTE — ED Notes (Signed)
Patient not able to stand at this time.

## 2013-04-25 NOTE — H&P (Signed)
History and Physical       Hospital Admission Note Date: 04/25/2013  Patient name: Debbie Spears Medical record number: [id number] Date of birth: Jun 23, 1912 Age: 79 y.o. Gender: female PCP: Oneal Grout, MD  Primary cardiologist: Dr. Tenny Craw  Chief Complaint:  Acute onset of dizziness today around 9:30 AM  HPI: Patient is a 79 year old female with a history of hypertension, hyperlipidemia, chronic diastolic CHF, atrial fibrillation (not a Coumadin candidate per Dr. Tenny Craw) has a history of prior cerebellar CVA was brought by family for persistent dizziness today. Patient is is independent with her ADLs, was standing at the farm stand around 9:30 AM, when she bent down to pick up something and had acute onset of dizziness. Patient felt nauseous and dry heaving. She did not have any vomiting, denied any chest pain, palpitations or any diaphoresis. Patient denied any slurred speech, focal weakness, numbness or tingling. In the ED, the patient was unable to ambulate due to unsteadiness and dizziness, hence hospitalist service was requested for further workup and observation.   Review of Systems:  Constitutional: Denies fever, chills, diaphoresis, poor appetite and fatigue.  HEENT: Denies photophobia, eye pain, redness, hearing loss, ear pain, congestion, sore throat, rhinorrhea, sneezing, mouth sores, trouble swallowing, neck pain, neck stiffness and tinnitus.   Respiratory: Denies SOB, DOE, cough, chest tightness,  and wheezing.   Cardiovascular: Denies chest pain, palpitations and leg swelling.  Gastrointestinal: + nausea, no vomiting, abdominal pain, no diarrhea, + constipation, blood in stool and abdominal distention.  Genitourinary: Denies dysuria, urgency, frequency, hematuria, flank pain and difficulty urinating.  Musculoskeletal: Denies myalgias, back pain, joint swelling, arthralgias and gait problem.  Skin: Denies pallor, rash and  wound.  Neurological: Please see history of present illness  Hematological: Denies adenopathy. + Easy bruising, personal or family bleeding history  Psychiatric/Behavioral: Denies suicidal ideation, mood changes, confusion, nervousness, sleep disturbance and agitation  Past Medical History: Past Medical History  Diagnosis Date  . Hypertension   . Hyperlipidemia   . Glucose intolerance (impaired glucose tolerance)   . Osteoporosis   . Osteoarthritis   . Atrial fibrillation   . Diastolic CHF, acute    Past Surgical History  Procedure Laterality Date  . Laparoscopic cholecystectomy  March 02, 2007    Medications: Prior to Admission medications   Medication Sig Start Date End Date Taking? Authorizing Provider  amLODipine (NORVASC) 5 MG tablet Take 1 tablet (5 mg total) by mouth daily. 01/20/12  Yes Pricilla Riffle, MD  aspirin EC 325 MG tablet Take 325 mg by mouth daily.     Yes Historical Provider, MD  benazepril (LOTENSIN) 20 MG tablet Take 20 mg by mouth daily.   Yes Historical Provider, MD  CALCIUM PO Take 1 tablet by mouth daily with lunch.    Yes Historical Provider, MD  docusate sodium (COLACE) 100 MG capsule Take 100-200 mg by mouth See admin instructions. Take 1 tablet every day at 5pm with dinner (take 2 tablets for severe constipation)   Yes Historical Provider, MD  furosemide (LASIX) 40 MG tablet Take 1 tablet (40 mg total) by mouth daily. 01/20/12  Yes Pricilla Riffle, MD  hydrALAZINE (APRESOLINE) 25 MG tablet Take 25 mg by mouth 4 (four) times daily. 8am, 12pm, 5pm, 10pm   Yes Historical Provider, MD  Multiple Vitamin (MULTIVITAMIN WITH MINERALS) TABS tablet Take 1 tablet by mouth daily with lunch. Viactiv chewables   Yes Historical Provider, MD  potassium chloride SA (KLOR-CON M20) 20 MEQ tablet Take  1 tablet (20 mEq total) by mouth daily. 04/15/13  Yes Pricilla Riffle, MD    Allergies:   Allergies  Allergen Reactions  . Olmesartan Medoxomil Diarrhea and Nausea And Vomiting     Social History:  reports that she has never smoked. She does not have any smokeless tobacco history on file. She reports that she does not drink alcohol or use illicit drugs. she lives independently behind her daughter's house, ambulates with a cane occasionally for longer distance otherwise she's functional with her ADLs  Family History: Family History  Problem Relation Age of Onset  . Stroke Mother   . Heart attack Brother     Physical Exam: Blood pressure 152/53, pulse 71, temperature 97.4 F (36.3 C), temperature source Oral, resp. rate 24, SpO2 94.00%. General: Alert, awake, oriented x3, in no acute distress. HEENT: normocephalic, atraumatic, anicteric sclera, pink conjunctiva, pupils equal and reactive to light and accomodation, oropharynx clear Neck: supple, no masses or lymphadenopathy, no goiter, no bruits  Heart: Regular rate and rhythm, without murmurs, rubs or gallops. Lungs: Clear to auscultation bilaterally, no wheezing, rales or rhonchi. Abdomen: Soft, nontender, nondistended, positive bowel sounds, no masses. Extremities: No clubbing, cyanosis or edema with positive pedal pulses. Neuro: Grossly intact, no focal neurological deficits, strength 5/5 upper and lower extremities bilaterally Dix-Hallpike was inconclusive as patient did not want to lie down. No nystagmus was noticed. Psych: alert and oriented x 3, normal mood and affect Skin: no rashes or lesions, warm and dry   LABS on Admission:  Basic Metabolic Panel:  Recent Labs Lab 04/25/13 1429  NA 140  K 4.3  CL 99  CO2 29  GLUCOSE 174*  BUN 28*  CREATININE 1.40*  CALCIUM 9.7   Liver Function Tests:  Recent Labs Lab 04/25/13 1429  AST 32  ALT 15  ALKPHOS 79  BILITOT 0.3  PROT 6.5  ALBUMIN 3.7   No results found for this basename: LIPASE, AMYLASE,  in the last 168 hours No results found for this basename: AMMONIA,  in the last 168 hours CBC:  Recent Labs Lab 04/25/13 1429  WBC 7.9   NEUTROABS 7.3  HGB 14.1  HCT 40.8  MCV 95.8  PLT 142*     Radiological Exams on Admission: Ct Head Wo Contrast  04/25/2013   *RADIOLOGY REPORT*  Clinical Data: Dizziness.  Nausea and vomiting  CT HEAD WITHOUT CONTRAST  Technique:  Contiguous axial images were obtained from the base of the skull through the vertex without contrast.  Comparison: None.  Findings: Generalized atrophy.  Chronic right parietal cortical infarct.  Mild chronic microvascular ischemic change.  Small chronic infarct left cerebellum.  Negative for acute infarct, hemorrhage, or mass lesion.  Negative for skull lesion.  IMPRESSION: Chronic right parietal infarct.  Small chronic infarct left cerebellum.  No acute abnormality.   Original Report Authenticated By: Janeece Riggers, M.D.    Assessment/Plan Principal Problem:   Vertigo versus near syncope: - Will check orthostatic vitals, admit for observation on telemetry, serial troponin - Given her prior history of cerebellar and right parietal CVA, will check MRI of the brain to rule out any acute infarct - PT evaluation, meclizine, gentle hydration today. Hold Lasix today.  -Check 2-D echo, has a history of diastolic CHF, atrial fibrillation, has been doing well otherwise.  Active Problems:    HYPERTENSION - Currently stable, continue hydralazine and amlodipine,. Hold Lotensin, Lasix today, creatinine 1.4 which is close to her baseline.    Atrial fibrillation: -  Rate controlled, on aspirin 325 mg daily. Patient is not a Coumadin candidate per her cardiologist, Dr. Tenny Craw (see office note from 2013).     Dehydration/  Acute kidney injury - Patient appears to be slightly dehydrated, she had been complaining of some GI upset and nausea in the last few days but no vomiting. - Will provide gentle hydration and hold Lasix today.    H/O: CVA (cerebrovascular accident) - No acute focal neurological deficits, follow MRI, continue aspirin   DVT prophylaxis: Lovenox   CODE  STATUS: Discussed in detail with the patient, requested to be DO NOT RESUSCITATE status   Further plan will depend as patient's clinical course evolves and further radiologic and laboratory data become available.   Time Spent on Admission: 1 hour  Kamoni Gentles M.D. [facility] 04/25/2013, 4:13 PM Pager: [phone]  If 7PM-7AM, please contact night-coverage [email or web address] Password [email or web address]

## 2013-04-25 NOTE — Progress Notes (Signed)
Received patient from ED, BP 163/70  Pulse 75  Temp(Src) 97.5 F (36.4 C) (Oral)  Resp 18  SpO2 93% Patient alert and oriented.  Daughter and patient oriented to room, instructed patient not to get OOB without assistance. Patient reports "feeling a little dizzy when I turn my head."  Will continue to monitor closely, patient moves all extremities without difficulty.

## 2013-04-25 NOTE — ED Notes (Signed)
Per EMS- EMS called to home for complaint of nausea and vomiting that started after patient walked to the fruit stand and went back into the house to put things in the refrigerator. Leaned over and became dizzy with nausea and dry heaving. IV placed in left forearm 18G. Given Zofran by EMS. Normal sinus rhythm. BP 180/80, HR 70. Family reports patient has not been feeling well this week.

## 2013-04-25 NOTE — ED Provider Notes (Signed)
Medical screening examination/treatment/procedure(s) were conducted as a shared visit with non-physician practitioner(s) and myself.  I personally evaluated the patient during the encounter. Patient with dizziness. Unable ambulate. Lab work is overall reassuring. Will be admitted to internal medicine for further evaluation treatment.  Debbie Spears. Rubin Payor, MD 04/25/13 [phone]

## 2013-04-25 NOTE — ED Notes (Signed)
Admitting MD at beside. 

## 2013-04-26 DIAGNOSIS — I6350 Cerebral infarction due to unspecified occlusion or stenosis of unspecified cerebral artery: Secondary | ICD-10-CM

## 2013-04-26 DIAGNOSIS — I63219 Cerebral infarction due to unspecified occlusion or stenosis of unspecified vertebral arteries: Secondary | ICD-10-CM

## 2013-04-26 LAB — BASIC METABOLIC PANEL
CO2: 30 mEq/L (ref 19–32)
Calcium: 9.2 mg/dL (ref 8.4–10.5)
Glucose, Bld: 88 mg/dL (ref 70–99)
Potassium: 4.2 mEq/L (ref 3.5–5.1)
Sodium: 143 mEq/L (ref 135–145)

## 2013-04-26 LAB — CBC
Hemoglobin: 12.9 g/dL (ref 12.0–15.0)
MCH: 31.5 pg (ref 26.0–34.0)
MCV: 97.6 fL (ref 78.0–100.0)
RBC: 4.1 MIL/uL (ref 3.87–5.11)

## 2013-04-26 LAB — TROPONIN I: Troponin I: <0.3 ng/mL (ref <0.30)

## 2013-04-26 MED ORDER — CLOPIDOGREL BISULFATE 75 MG PO TABS
75.0000 mg | ORAL_TABLET | Freq: Every day | ORAL | Status: DC
  Administered 2013-04-26 – 2013-04-29 (×4): 75 mg via ORAL
  Filled 2013-04-26 (×4): qty 1

## 2013-04-26 MED ORDER — ATORVASTATIN CALCIUM 20 MG PO TABS
20.0000 mg | ORAL_TABLET | Freq: Every day | ORAL | Status: DC
  Administered 2013-04-26 – 2013-04-29 (×4): 20 mg via ORAL
  Filled 2013-04-26 (×4): qty 1

## 2013-04-26 NOTE — Progress Notes (Signed)
OT EVALUATION  PTA, pt lived in independently by herself. Pt with functional decline with ADL and functional mobility for ADL. Pt with functional decline with ADL and mobility and will benefit from skilled OT services to facilitate D/C to CIR due to below deficits.   04/26/13 1100  OT Visit Information  Last OT Received On 04/26/13  Assistance Needed +1  History of Present Illness pt presents with hx of suddne onset dizziness lasting until pt arrived to ED.  MRI showed R Cerebellar Infarct and Vertebral Occlusion.    Precautions  Precautions Fall  Precaution Comments dizziness  Restrictions  Weight Bearing Restrictions No  Home Living  Family/patient expects to be discharged to: Private residence  Living Arrangements Alone  Available Help at Discharge Family;Available 24 hours/day (family can help as much or little as needed.  )  Type of Home House  Home Access Stairs to enter  Entrance Stairs-Number of Steps 1  Home Layout One level  Bathroom Shower/Tub Tub/shower unit  Shower/tub characteristics Archivist Yes  How Accessible Accessible via walker  Home Equipment Walker - 2 wheels;Cane - single point;BSC  Prior Function  Level of Independence Independent with assistive device(s)  Comments Only uses cane outside  Communication  Communication No difficulties  Cognition  Arousal/Alertness Awake/alert  Behavior During Therapy WFL for tasks assessed/performed  Overall Cognitive Status Within Functional Limits for tasks assessed  Upper Extremity Assessment  Upper Extremity Assessment Generalized weakness;RUE deficits/detail  RUE Coordination decreased fine motor;decreased gross motor  Lower Extremity Assessment  Lower Extremity Assessment Generalized weakness  ADL  Eating/Feeding Supervision/safety  Where Assessed - Eating/Feeding Chair  Grooming Minimal assistance  Where Assessed - Grooming Supported sitting  Upper Body  Bathing Set up;Supervision/safety  Where Assessed - Upper Body Bathing Supported sitting  Lower Body Bathing Moderate assistance  Where Assessed - Lower Body Bathing Supported sit to stand  Upper Body Dressing Minimal assistance  Where Assessed - Upper Body Dressing Supported sitting  Lower Body Dressing Moderate assistance  Where Assessed - Lower Body Dressing Supported sit to Scientist, research (life sciences) Minimal assistance  Toilet Transfer Method Other (comment);Sit to stand (ambulating)  Acupuncturist Comfort height toilet  Toileting - Clothing Manipulation and Hygiene Minimal assistance  Where Assessed - Toileting Clothing Manipulation and Hygiene Sit to stand from 3-in-1 or toilet  Transfers/Ambulation Related to ADLs min A. Better performance with RW  ADL Comments functional decline  Vision - History  Baseline Vision Other (comment)  Visual History Cataracts  Patient Visual Report Other (comment) (c/o "a little differnece")  Vision - Assessment  Eye Alignment [facility]  Additional Comments will further assess  Perception  Perception WFL  Praxis  Praxis Intact  Bed Mobility  Details for Bed Mobility Assistance Moves well, but limited by nausea and vomiting once in sitting position.  RN made aware.    Transfers  Transfers Sit to Stand;Stand to Sit  Sit to Stand With upper extremity assist;4: Min assist;From chair/3-in-1  Stand to Sit 4: Min guard;With upper extremity assist;To chair/3-in-1;With armrests  Details for Transfer Assistance use gaze stabilization durign mobility  Balance  Balance Assessed Yes  Static Sitting Balance  Static Sitting - Balance Support No upper extremity supported;Feet supported  Static Sitting - Level of Assistance 5: Stand by assistance  Dynamic Sitting Balance  Dynamic Sitting - Balance Support Feet supported;No upper extremity supported  Dynamic Sitting - Level of Assistance 5: Stand by assistance  Static Standing  Balance  Static Standing  - Balance Support Right upper extremity supported;During functional activity  Static Standing - Level of Assistance 3: Mod assist (at times. )  OT - End of Session  Equipment Utilized During Treatment Gait belt  Activity Tolerance Patient tolerated treatment well  Patient left in chair;with call bell/phone within reach  Nurse Communication Mobility status  OT Assessment  OT Recommendation/Assessment Patient needs continued OT Services  OT Problem List Decreased strength;Decreased range of motion;Decreased activity tolerance;Impaired balance (sitting and/or standing);Impaired vision/perception;Decreased coordination;Decreased cognition;Decreased knowledge of use of DME or AE;Decreased safety awareness;Impaired UE functional use  OT Therapy Diagnosis  Generalized weakness;Disturbance of vision;Ataxia  OT Plan  OT Frequency Min 2X/week  OT Treatment/Interventions Self-care/ADL training;Therapeutic exercise;Neuromuscular education;Energy conservation;DME and/or AE instruction;Manual therapy;Therapeutic activities;Visual/perceptual remediation/compensation;Patient/family education;Balance training  OT Recommendation  Recommendations for Other Services Rehab consult  Follow Up Recommendations CIR  OT Equipment None recommended by OT  Individuals Consulted  Consulted and Agree with Results and Recommendations Patient  Acute Rehab OT Goals  Patient Stated Goal Back to normal  OT Goal Formulation With patient  Potential to Achieve Goals Good  OT Time Calculation  OT Start Time 1138  OT Stop Time 1155  OT Time Calculation (min) 17 min  OT General Charges  $OT Visit 1 Procedure  OT Evaluation  $Initial OT Evaluation Tier I 1 Procedure  OT Treatments  $Self Care/Home Management  8-22 mins  Written Expression  Dominant Hand Right  Luisa Dago, OTR/L  [phone] 04/26/2013

## 2013-04-26 NOTE — Evaluation (Addendum)
Physical Therapy Evaluation Patient Details Name: Debbie Spears MRN: [id number] DOB: 03/09/1912 Today's Date: 04/26/2013 Time: 1191-4782 PT Time Calculation (min): 25 min  PT Assessment / Plan / Recommendation History of Present Illness  pt presents with hx of suddne onset dizziness lasting until pt arrived to ED.  MRI showed R Cerebellar Infarct and Vertebral Occlusion.    Clinical Impression  Pt is a very independent 79 yr old woman who is limited by dizziness.  Feel with continued therapy pt will make great progress.      PT Assessment  Patient needs continued PT services    Follow Up Recommendations  CIR    Does the patient have the potential to tolerate intense rehabilitation      Barriers to Discharge        Equipment Recommendations  None recommended by PT    Recommendations for Other Services Rehab consult   Frequency Min 4X/week    Precautions / Restrictions Precautions Precautions: Fall Restrictions Weight Bearing Restrictions: No   Pertinent Vitals/Pain Denies pain.        Mobility  Bed Mobility Bed Mobility: Supine to Sit;Sitting - Scoot to Edge of Bed Supine to Sit: 5: Supervision;With rails Sitting - Scoot to Edge of Bed: 5: Supervision Details for Bed Mobility Assistance: Moves well, but limited by nausea and vomiting once in sitting position.  RN made aware.   Transfers Transfers: Sit to Stand;Stand to [employer] Transfers Sit to Stand: 4: Min guard;With upper extremity assist;From bed Stand to Sit: 4: Min guard;With upper extremity assist;To chair/3-in-1;With armrests Stand Pivot Transfers: 4: Min guard Details for Transfer Assistance: pt again feeling dizzy with mobility, but no vomiting this time.   Ambulation/Gait Ambulation/Gait Assistance: Not tested (comment) Stairs: No Wheelchair Mobility Wheelchair Mobility: No Modified Rankin (Stroke Patients Only) Pre-Morbid Rankin Score: Slight disability Modified Rankin: Severe disability     Exercises     PT Diagnosis: Difficulty walking  PT Problem List: Decreased strength;Decreased activity tolerance;Decreased balance;Decreased mobility;Decreased knowledge of use of DME PT Treatment Interventions: DME instruction;Gait training;Stair training;Functional mobility training;Therapeutic activities;Therapeutic exercise;Balance training;Neuromuscular re-education;Patient/family education     PT Goals(Current goals can be found in the care plan section) Acute Rehab PT Goals Patient Stated Goal: Back to normal PT Goal Formulation: With patient Time For Goal Achievement: 05/10/13 Potential to Achieve Goals: Good  Visit Information  Last PT Received On: 04/26/13 Assistance Needed: +1 History of Present Illness: pt presents with hx of suddne onset dizziness lasting until pt arrived to ED.  MRI showed R Cerebellar Infarct and Vertebral Occlusion.         Prior Functioning  Home Living Family/patient expects to be discharged to:: Private residence Living Arrangements: Alone Available Help at Discharge: Family;Available 24 hours/day (family can help as much or little as needed.  ) Type of Home: House Home Access: Stairs to enter [employer] of Steps: 1 Home Layout: One level Home Equipment: Cane - single point;Bedside commode Prior Function Level of Independence: Independent with assistive device(s) Comments: Only uses cane outside Communication Communication: No difficulties    Cognition  Cognition Arousal/Alertness: Awake/alert Behavior During Therapy: WFL for tasks assessed/performed Overall Cognitive Status: Within Functional Limits for tasks assessed    Extremity/Trunk Assessment Upper Extremity Assessment Upper Extremity Assessment: Defer to OT evaluation Lower Extremity Assessment Lower Extremity Assessment: Overall WFL for tasks assessed   Balance Balance Balance Assessed: No  End of Session PT - End of Session Equipment Utilized During  Treatment: Gait belt Activity Tolerance:  (  Limited by nausea) Patient left: in chair;with call bell/phone within reach Nurse Communication: Mobility status  GP Functional Assessment Tool Used: Clinical Judgement Functional Limitation: Mobility: Walking and moving around Mobility: Walking and Moving Around Current Status ([id number]): At least 1 percent but less than 20 percent impaired, limited or restricted Mobility: Walking and Moving Around Goal Status [phone]): 0 percent impaired, limited or restricted Mobility: Walking and Moving Around Discharge Status [phone]): 0 percent impaired, limited or restricted   Sunny Schlein, [place] [phone] 04/26/2013, 9:44 AM

## 2013-04-26 NOTE — Progress Notes (Signed)
PATIENT DETAILS Name: Debbie Spears Age: 79 y.o. Sex: female Date of Birth: 25-Nov-1911 Admit Date: 04/25/2013 Admitting Physician Ripudeep Jenna Luo, MD JYN:[id number], [facility], MD  Subjective: Less vertigo today, somewhat nauseous.  Assessment/Plan: Principal Problem:   Vertigo - Secondary to acute right cerebellar CVA - Somewhat better, seen by physical therapy-await CIR evaluation.  Active Problems: Acute CVA - MRI brain positive for acute right cerebellar CVA - Given advanced age, I see no point in undergoing a full CVA workup-as it would not change management. I have spoken with the patient's daughter Randa Evens) at bedside, she does understand and is agreeable with no further workup at this point. - Since she was already on aspirin prior to this CVA, we will switch her over to Plavix, and statin - I have discontinued the order for 2-D echocardiogram, as noted above, wi'll not order a carotid ultrasound. Will however check a lipid panel    HYPERLIPIDEMIA - check LDL, add statin    HYPERTENSION - Continue with amlodipine and hydralazine    Atrial fibrillation - Seen by [facility] cardiology in the past-not a candidate for anticoagulation. - Rate controlled without the use of any medication    Dehydration - Resolved with IV fluids    Chronic kidney disease stage III - Monitor electrolytes periodically  Disposition: Remain inpatient  DVT Prophylaxis: Prophylactic Lovenox   Code Status: DNR  Family Communication Daughter at bedside  Procedures:  None  CONSULTS:  None   MEDICATIONS: Scheduled Meds: . amLODipine  5 mg Oral Daily  . clopidogrel  75 mg Oral Q breakfast  . enoxaparin (LOVENOX) injection  30 mg Subcutaneous Q24H  . hydrALAZINE  25 mg Oral TID WC & HS  . meclizine  12.5 mg Oral BID  . multivitamin with minerals  1 tablet Oral Q lunch  . sodium chloride  3 mL Intravenous Q12H   Continuous Infusions:  PRN Meds:.acetaminophen, acetaminophen, alum &  mag hydroxide-simeth, HYDROcodone-acetaminophen, HYDROmorphone (DILAUDID) injection, ondansetron (ZOFRAN) IV, ondansetron  Antibiotics: Anti-infectives   None       PHYSICAL EXAM: Vital signs in last 24 hours: Filed Vitals:   04/26/13 0200 04/26/13 0400 04/26/13 0749 04/26/13 1005  BP: 101/48 127/44 136/56 162/70  Pulse: 62 67 65   Temp: 97.9 F (36.6 C) 97.8 F (36.6 C) 97.9 F (36.6 C)   TempSrc: Oral Oral Oral   Resp: 18 18 18    Height:      Weight:      SpO2:  92% 93%     Weight change:  Filed Weights   04/25/13 1740  Weight: 47.628 kg (105 lb)   Body mass index is 19.2 kg/(m^2).   Gen Exam: Awake and alert with clear speech.   Neck: Supple, No JVD.   Chest: B/L Clear.   CVS: S1 S2 Regular, no murmurs.  Abdomen: soft, BS +, non tender, non distended.  Extremities: no edema, lower extremities warm to touch. Neurologic: Non Focal.   Skin: No Rash.   Wounds: N/A.    Intake/Output from previous day:  Intake/Output Summary (Last 24 hours) at 04/26/13 1223 Last data filed at 04/26/13 1007  Gross per 24 hour  Intake      3 ml  Output      0 ml  Net      3 ml     LAB RESULTS: CBC  Recent Labs Lab 04/25/13 1429 04/25/13 1942 04/26/13 0735  WBC 7.9 5.1 5.6  HGB 14.1 13.9 12.9  HCT  40.8 41.2 40.0  PLT 142* 147* 141*  MCV 95.8 96.0 97.6  MCH 33.1 32.4 31.5  MCHC 34.6 33.7 32.3  RDW 14.4 14.3 14.6  LYMPHSABS 0.3*  --   --   MONOABS 0.3  --   --   EOSABS 0.0  --   --   BASOSABS 0.0  --   --     Chemistries   Recent Labs Lab 04/25/13 1429 04/25/13 1942 04/26/13 0735  NA 140  --  143  K 4.3  --  4.2  CL 99  --  103  CO2 29  --  30  GLUCOSE 174*  --  88  BUN 28*  --  26*  CREATININE 1.40* 1.26* 1.45*  CALCIUM 9.7  --  9.2    CBG: No results found for this basename: GLUCAP,  in the last 168 hours  GFR Estimated Creatinine Clearance: 15.9 ml/min (by C-G formula based on Cr of 1.45).  Coagulation profile No results found for this  basename: INR, PROTIME,  in the last 168 hours  Cardiac Enzymes  Recent Labs Lab 04/25/13 1929 04/26/13 0123 04/26/13 0735  TROPONINI <0.30 <0.30 <0.30    No components found with this basename: POCBNP,  No results found for this basename: DDIMER,  in the last 72 hours No results found for this basename: HGBA1C,  in the last 72 hours No results found for this basename: CHOL, HDL, LDLCALC, TRIG, CHOLHDL, LDLDIRECT,  in the last 72 hours No results found for this basename: TSH, T4TOTAL, FREET3, T3FREE, THYROIDAB,  in the last 72 hours No results found for this basename: VITAMINB12, FOLATE, FERRITIN, TIBC, IRON, RETICCTPCT,  in the last 72 hours No results found for this basename: LIPASE, AMYLASE,  in the last 72 hours  Urine Studies No results found for this basename: UACOL, UAPR, USPG, UPH, UTP, UGL, UKET, UBIL, UHGB, UNIT, UROB, ULEU, UEPI, UWBC, URBC, UBAC, CAST, CRYS, UCOM, BILUA,  in the last 72 hours  MICROBIOLOGY: No results found for this or any previous visit (from the past 240 hour(s)).  RADIOLOGY STUDIES/RESULTS: Ct Head Wo Contrast  04/25/2013   *RADIOLOGY REPORT*  Clinical Data: Dizziness.  Nausea and vomiting  CT HEAD WITHOUT CONTRAST  Technique:  Contiguous axial images were obtained from the base of the skull through the vertex without contrast.  Comparison: None.  Findings: Generalized atrophy.  Chronic right parietal cortical infarct.  Mild chronic microvascular ischemic change.  Small chronic infarct left cerebellum.  Negative for acute infarct, hemorrhage, or mass lesion.  Negative for skull lesion.  IMPRESSION: Chronic right parietal infarct.  Small chronic infarct left cerebellum.  No acute abnormality.   Original Report Authenticated By: Janeece Riggers, M.D.   Mr Brain Wo Contrast  04/25/2013   *RADIOLOGY REPORT*  Clinical Data: Evaluate for CVA.  MRI HEAD WITHOUT CONTRAST  Technique:  Multiplanar, multiecho pulse sequences of the brain and surrounding structures were  obtained according to standard protocol without intravenous contrast.  Comparison: Head CT from the same day.  Findings:  Calvarium and upper cervical spine: No marrow signal abnormality.  Orbits: Bilateral cataract resection.  Sinuses: Clear. Mastoid and middle ears are clear.  Brain: Restricted diffusion within the inferior, medial right cerebellum, posterior inferior cerebellar artery territory, with some sparing of the lateral cerebellum.  Remote infarcts in the peripheral left cerebellum and right posterior border region (affecting the parietal lobes predominately).  Abnormal vertebral arteries: Loss of normal flow related signal loss within the right V3  segment, with poor or no flow in the right V4 segment. The left vertebral artery remains patent.  Global atrophy.  Negative for hydrocephalus, shift, or evidence of mass.  Critical Value/emergent results were called by telephone at the time of interpretation on 04/25/2013 at 23:30 to [facility], who verbally acknowledged these results.  IMPRESSION: 1.  Acute right posterior inferior cerebellar artery territory infarction.  Distal right vertebral artery occlusion (more likely) or slow flow. 2.  Remote ischemic injuries to the right parietal lobe and left cerebellum.   Original Report Authenticated By: Cynda Acres, MD  [facility] Pager:[phone]  If 7PM-7AM, please contact night-coverage [email or web address] Password [facility] 04/26/2013, 12:23 PM   LOS: 1 day

## 2013-04-26 NOTE — Progress Notes (Signed)
Rehab Admissions Coordinator Note:  Patient was screened by Brock Ra for appropriateness for an Inpatient Acute Rehab Consult.  At this time, there is already an order for an Inpatient Rehab consult.  Melanee Spry S 04/26/2013, 10:46 AM  I can be reached at [phone].

## 2013-04-26 NOTE — Consult Note (Signed)
Physical Medicine and Rehabilitation Consult  Reason for Consult: Dizziness, nausea Referring Physician:  Dr. Jerral Ralph   HPI: Debbie Spears is a 79 y.o. female with history of HTN, A Fib;who was admitted 04/25/13 with acute onset of dizziness and few days history of "upset stomach". CT head with chronic left cerebellar and right parietal infarct. MRI brain done revealing acute R-PICA territory infarct and distal right vertebral artery occlusion. Patient admitted for work up and PT evaluation done this am. Patient limited by dizziness as well as N/V. MD, PT recommending CIR.    Review of Systems  HENT: Positive for hearing loss.   Eyes: Negative for blurred vision and double vision.  Respiratory: Negative for cough and shortness of breath.   Cardiovascular: Negative for chest pain and palpitations.  Gastrointestinal: Negative for heartburn, nausea and vomiting.  Genitourinary: Negative for urgency and frequency.  Musculoskeletal: Negative for myalgias and back pain.  Neurological: Positive for dizziness, weakness and headaches (yesterday).   Past Medical History  Diagnosis Date  . Hypertension   . Hyperlipidemia   . Glucose intolerance (impaired glucose tolerance)   . Osteoporosis   . Osteoarthritis   . Atrial fibrillation   . Diastolic CHF, acute    Past Surgical History  Procedure Laterality Date  . Laparoscopic cholecystectomy  March 02, 2007   Family History  Problem Relation Age of Onset  . Stroke Mother   . Heart attack Brother    Social History:  Lives alone  (on Son-in law/daughter's farm). Independent--does cooking, cleaning--was canning beans yesterday.  She has never smoked. She does not have any smokeless tobacco history on file. She reports that she does not drink alcohol or use illicit drugs.   Allergies  Allergen Reactions  . Olmesartan Medoxomil Diarrhea and Nausea And Vomiting   Medications Prior to Admission  Medication Sig Dispense Refill  . amLODipine  (NORVASC) 5 MG tablet Take 1 tablet (5 mg total) by mouth daily.  90 tablet  0  . aspirin EC 325 MG tablet Take 325 mg by mouth daily.        . benazepril (LOTENSIN) 20 MG tablet Take 20 mg by mouth daily.      Marland Kitchen CALCIUM PO Take 1 tablet by mouth daily with lunch.       . docusate sodium (COLACE) 100 MG capsule Take 100-200 mg by mouth See admin instructions. Take 1 tablet every day at 5pm with dinner (take 2 tablets for severe constipation)      . furosemide (LASIX) 40 MG tablet Take 1 tablet (40 mg total) by mouth daily.  30 tablet  2  . hydrALAZINE (APRESOLINE) 25 MG tablet Take 25 mg by mouth 4 (four) times daily. 8am, 12pm, 5pm, 10pm      . Multiple Vitamin (MULTIVITAMIN WITH MINERALS) TABS tablet Take 1 tablet by mouth daily with lunch. Viactiv chewables      . potassium chloride SA (KLOR-CON M20) 20 MEQ tablet Take 1 tablet (20 mEq total) by mouth daily.  30 tablet  2    Home: Home Living Family/patient expects to be discharged to:: Private residence Living Arrangements: Alone Available Help at Discharge: Family;Available 24 hours/day (family can help as much or little as needed.  ) Type of Home: House Home Access: Stairs to enter [employer] of Steps: 1 Home Layout: One level Home Equipment: Cane - single point;Bedside commode  Functional History: Prior Function Comments: Only uses cane outside Functional Status:  Mobility: Bed Mobility Bed Mobility: Supine  to Sit;Sitting - Scoot to Edge of Bed Supine to Sit: 5: Supervision;With rails Sitting - Scoot to Edge of Bed: 5: Supervision Transfers Transfers: Sit to Stand;Stand to [employer] Transfers Sit to Stand: 4: Min guard;With upper extremity assist;From bed Stand to Sit: 4: Min guard;With upper extremity assist;To chair/3-in-1;With armrests Stand Pivot Transfers: 4: Min guard Ambulation/Gait Ambulation/Gait Assistance: Not tested (comment) Stairs: No Wheelchair Mobility Wheelchair Mobility: No  ADL:     Cognition: Cognition Overall Cognitive Status: Within Functional Limits for tasks assessed Orientation Level: Oriented X4 Cognition Arousal/Alertness: Awake/alert Behavior During Therapy: WFL for tasks assessed/performed Overall Cognitive Status: Within Functional Limits for tasks assessed  Blood pressure 162/70, pulse 65, temperature 97.9 F (36.6 C), temperature source Oral, resp. rate 18, height 5\' 2"  (1.575 m), weight 47.628 kg (105 lb), SpO2 93.00%.  Physical Exam  Nursing note and vitals reviewed. Constitutional: She is oriented to person, place, and time. She appears well-developed.  Thin elderly female, looks younger than stated age.   HENT:  Head: Normocephalic and atraumatic.  Eyes: Conjunctivae are normal. Pupils are equal, round, and reactive to light.  Neck: Normal range of motion. Neck supple.  Cardiovascular: An irregularly irregular rhythm present.  Murmur heard. Pulmonary/Chest: Effort normal. No respiratory distress. She has rales in the right lower field and the left lower field.  Abdominal: Soft. Bowel sounds are normal. She exhibits no distension. There is no tenderness.  Musculoskeletal: She exhibits no edema and no tenderness.  Neurological: She is alert and oriented to person, place, and time.  Speech soft but clear. Few beats nystagmus to right lateral field. Able to follow 2 step commands without difficulty. Ataxia RUE and RLE with FTN and HTS. No diplopia, gaze conjugate. Cognitively appropriate. Strength 4/5 RUE and RLE and 4+ on left. No gross sensory deficits. .   Skin: Skin is warm and dry.  Bruising along right arm    Results for orders placed during the hospital encounter of 04/25/13 (from the past 24 hour(s))  URINALYSIS, ROUTINE W REFLEX MICROSCOPIC     Status: None   Collection Time    04/25/13  1:23 PM      Result Value Range   Color, Urine YELLOW  YELLOW   APPearance CLEAR  CLEAR   Specific Gravity, Urine 1.007  1.005 - 1.030   pH 7.0   5.0 - 8.0   Glucose, UA NEGATIVE  NEGATIVE mg/dL   Hgb urine dipstick NEGATIVE  NEGATIVE   Bilirubin Urine NEGATIVE  NEGATIVE   Ketones, ur NEGATIVE  NEGATIVE mg/dL   Protein, ur NEGATIVE  NEGATIVE mg/dL   Urobilinogen, UA 0.2  0.0 - 1.0 mg/dL   Nitrite NEGATIVE  NEGATIVE   Leukocytes, UA NEGATIVE  NEGATIVE  CBC WITH DIFFERENTIAL     Status: Abnormal   Collection Time    04/25/13  2:29 PM      Result Value Range   WBC 7.9  4.0 - 10.5 K/uL   RBC 4.26  3.87 - 5.11 MIL/uL   Hemoglobin 14.1  12.0 - 15.0 g/dL   HCT 16.1  09.6 - 04.5 %   MCV 95.8  78.0 - 100.0 fL   MCH 33.1  26.0 - 34.0 pg   MCHC 34.6  30.0 - 36.0 g/dL   RDW 40.9  81.1 - 91.4 %   Platelets 142 (*) 150 - 400 K/uL   Neutrophils Relative % 92 (*) 43 - 77 %   Neutro Abs 7.3  1.7 - 7.7 K/uL  Lymphocytes Relative 4 (*) 12 - 46 %   Lymphs Abs 0.3 (*) 0.7 - 4.0 K/uL   Monocytes Relative 4  3 - 12 %   Monocytes Absolute 0.3  0.1 - 1.0 K/uL   Eosinophils Relative 0  0 - 5 %   Eosinophils Absolute 0.0  0.0 - 0.7 K/uL   Basophils Relative 0  0 - 1 %   Basophils Absolute 0.0  0.0 - 0.1 K/uL  COMPREHENSIVE METABOLIC PANEL     Status: Abnormal   Collection Time    04/25/13  2:29 PM      Result Value Range   Sodium 140  135 - 145 mEq/L   Potassium 4.3  3.5 - 5.1 mEq/L   Chloride 99  96 - 112 mEq/L   CO2 29  19 - 32 mEq/L   Glucose, Bld 174 (*) 70 - 99 mg/dL   BUN 28 (*) 6 - 23 mg/dL   Creatinine, Ser 2.13 (*) 0.50 - 1.10 mg/dL   Calcium 9.7  8.4 - 08.6 mg/dL   Total Protein 6.5  6.0 - 8.3 g/dL   Albumin 3.7  3.5 - 5.2 g/dL   AST 32  0 - 37 U/L   ALT 15  0 - 35 U/L   Alkaline Phosphatase 79  39 - 117 U/L   Total Bilirubin 0.3  0.3 - 1.2 mg/dL   GFR calc non Af Amer 30 (*) >90 mL/min   GFR calc Af Amer 35 (*) >90 mL/min  POCT I-STAT TROPONIN I     Status: None   Collection Time    04/25/13  3:24 PM      Result Value Range   Troponin i, poc 0.01  0.00 - 0.08 ng/mL   Comment 3           TROPONIN I     Status: None    Collection Time    04/25/13  7:29 PM      Result Value Range   Troponin I <0.30  <0.30 ng/mL  CBC     Status: Abnormal   Collection Time    04/25/13  7:42 PM      Result Value Range   WBC 5.1  4.0 - 10.5 K/uL   RBC 4.29  3.87 - 5.11 MIL/uL   Hemoglobin 13.9  12.0 - 15.0 g/dL   HCT 57.8  46.9 - 62.9 %   MCV 96.0  78.0 - 100.0 fL   MCH 32.4  26.0 - 34.0 pg   MCHC 33.7  30.0 - 36.0 g/dL   RDW 52.8  41.3 - 24.4 %   Platelets 147 (*) 150 - 400 K/uL  CREATININE, SERUM     Status: Abnormal   Collection Time    04/25/13  7:42 PM      Result Value Range   Creatinine, Ser 1.26 (*) 0.50 - 1.10 mg/dL   GFR calc non Af Amer 34 (*) >90 mL/min   GFR calc Af Amer 39 (*) >90 mL/min  TROPONIN I     Status: None   Collection Time    04/26/13  1:23 AM      Result Value Range   Troponin I <0.30  <0.30 ng/mL  BASIC METABOLIC PANEL     Status: Abnormal   Collection Time    04/26/13  7:35 AM      Result Value Range   Sodium 143  135 - 145 mEq/L   Potassium 4.2  3.5 - 5.1 mEq/L  Chloride 103  96 - 112 mEq/L   CO2 30  19 - 32 mEq/L   Glucose, Bld 88  70 - 99 mg/dL   BUN 26 (*) 6 - 23 mg/dL   Creatinine, Ser 9.60 (*) 0.50 - 1.10 mg/dL   Calcium 9.2  8.4 - 45.4 mg/dL   GFR calc non Af Amer 29 (*) >90 mL/min   GFR calc Af Amer 33 (*) >90 mL/min  CBC     Status: Abnormal   Collection Time    04/26/13  7:35 AM      Result Value Range   WBC 5.6  4.0 - 10.5 K/uL   RBC 4.10  3.87 - 5.11 MIL/uL   Hemoglobin 12.9  12.0 - 15.0 g/dL   HCT 09.8  11.9 - 14.7 %   MCV 97.6  78.0 - 100.0 fL   MCH 31.5  26.0 - 34.0 pg   MCHC 32.3  30.0 - 36.0 g/dL   RDW 82.9  56.2 - 13.0 %   Platelets 141 (*) 150 - 400 K/uL  TROPONIN I     Status: None   Collection Time    04/26/13  7:35 AM      Result Value Range   Troponin I <0.30  <0.30 ng/mL   Ct Head Wo Contrast  04/25/2013   *RADIOLOGY REPORT*  Clinical Data: Dizziness.  Nausea and vomiting  CT HEAD WITHOUT CONTRAST  Technique:  Contiguous axial images were  obtained from the base of the skull through the vertex without contrast.  Comparison: None.  Findings: Generalized atrophy.  Chronic right parietal cortical infarct.  Mild chronic microvascular ischemic change.  Small chronic infarct left cerebellum.  Negative for acute infarct, hemorrhage, or mass lesion.  Negative for skull lesion.  IMPRESSION: Chronic right parietal infarct.  Small chronic infarct left cerebellum.  No acute abnormality.   Original Report Authenticated By: Janeece Riggers, M.D.   Mr Brain Wo Contrast  04/25/2013   *RADIOLOGY REPORT*  Clinical Data: Evaluate for CVA.  MRI HEAD WITHOUT CONTRAST  Technique:  Multiplanar, multiecho pulse sequences of the brain and surrounding structures were obtained according to standard protocol without intravenous contrast.  Comparison: Head CT from the same day.  Findings:  Calvarium and upper cervical spine: No marrow signal abnormality.  Orbits: Bilateral cataract resection.  Sinuses: Clear. Mastoid and middle ears are clear.  Brain: Restricted diffusion within the inferior, medial right cerebellum, posterior inferior cerebellar artery territory, with some sparing of the lateral cerebellum.  Remote infarcts in the peripheral left cerebellum and right posterior border region (affecting the parietal lobes predominately).  Abnormal vertebral arteries: Loss of normal flow related signal loss within the right V3 segment, with poor or no flow in the right V4 segment. The left vertebral artery remains patent.  Global atrophy.  Negative for hydrocephalus, shift, or evidence of mass.  Critical Value/emergent results were called by telephone at the time of interpretation on 04/25/2013 at 23:30 to [facility], who verbally acknowledged these results.  IMPRESSION: 1.  Acute right posterior inferior cerebellar artery territory infarction.  Distal right vertebral artery occlusion (more likely) or slow flow. 2.  Remote ischemic injuries to the right parietal lobe and left  cerebellum.   Original Report Authenticated By: Tiburcio Pea    Assessment/Plan: Diagnosis: Right PICA infarct 1. Does the need for close, 24 hr/day medical supervision in concert with the patient's rehab needs make it unreasonable for this patient to be served in a less intensive setting?  Yes 2. Co-Morbidities requiring supervision/potential complications: htn, AKI 3. Due to bladder management, bowel management, safety, skin/wound care, disease management, medication administration and patient education, does the patient require 24 hr/day rehab nursing? Yes 4. Does the patient require coordinated care of a physician, rehab nurse, PT (1-2 hrs/day, 5 days/week) and OT (1-2 hrs/day, 5 days/week) to address physical and functional deficits in the context of the above medical diagnosis(es)? Yes Addressing deficits in the following areas: balance, endurance, locomotion, strength, transferring, bowel/bladder control, bathing, dressing, feeding, grooming, toileting and psychosocial support 5. Can the patient actively participate in an intensive therapy program of at least 3 hrs of therapy per day at least 5 days per week? Yes 6. The potential for patient to make measurable gains while on inpatient rehab is good and fair 7. Anticipated functional outcomes upon discharge from inpatient rehab are supervision to mod I with PT, supervision to mod I with OT, n/a with SLP. 8. Estimated rehab length of stay to reach the above functional goals is: TBD 9. Does the patient have adequate social supports to accommodate these discharge functional goals? Potentially 10. Anticipated D/C setting: Home 11. Anticipated post D/C treatments: HH therapy 12. Overall Rehab/Functional Prognosis: good  RECOMMENDATIONS: This patient's condition is appropriate for continued rehabilitative care in the following setting: [facility] Patient has agreed to participate in recommended program. Potentially Note that insurance prior  authorization may be required for reimbursement for recommended care.  Comment: Pt just admitted yesterday. Will follow for medical and functional progress over the weekend. She tells me she would prefer to go home but is open to inpatient rehab if she struggles to progress. Will need supervision at home upon dc. Rehab RN to follow up.   Ranelle Oyster, MD, [place] [facility]     04/26/2013

## 2013-04-26 NOTE — Progress Notes (Signed)
Utilization review completed.  

## 2013-04-27 LAB — LIPID PANEL
Cholesterol: 167 mg/dL (ref 0–200)
LDL Cholesterol: 96 mg/dL (ref 0–99)
Total CHOL/HDL Ratio: 2.8 RATIO
Triglycerides: 57 mg/dL (ref <150)
VLDL: 11 mg/dL (ref 0–40)

## 2013-04-27 MED ORDER — DOCUSATE SODIUM 100 MG PO CAPS
100.0000 mg | ORAL_CAPSULE | Freq: Two times a day (BID) | ORAL | Status: DC
  Administered 2013-04-27 – 2013-04-29 (×4): 100 mg via ORAL
  Filled 2013-04-27 (×5): qty 1

## 2013-04-27 NOTE — Progress Notes (Signed)
PATIENT DETAILS Name: Debbie Spears Age: 79 y.o. Sex: female Date of Birth: 19-Apr-1912 Admit Date: 04/25/2013 Admitting Physician Ripudeep Jenna Luo, MD [id number], [facility], MD  Subjective: Vertigo better, nausea resolved  Assessment/Plan: Principal Problem:   Vertigo - Secondary to acute right cerebellar CVA - Somewhat better, seen by physical therapy-appreciate CIR evaluation.Still unsteady on her feet-lives alone, will need to monitor progress over the weekend, and re-evaluate on Monday for SNF vs CIR vs HHPT -  Active Problems: Acute CVA - MRI brain positive for acute right cerebellar CVA - Given advanced age, I see no point in undergoing a full CVA workup-as it would not change management. I have spoken with the patient's daughter Debbie Spears) at bedside, she does understand and is agreeable with no further workup at this point. - Since she was already on aspirin prior to this CVA, we will switch her over to Plavix, and statin - I have discontinued the order for 2-D echocardiogram, as noted above, wi'll not order a carotid ultrasound.    HYPERLIPIDEMIA - check LDL, add statin    HYPERTENSION - Continue with amlodipine and hydralazine-fluctuating-monitor for 1 more day-before adjusting    Atrial fibrillation - Seen by [facility] cardiology in the past-not a candidate for anticoagulation. - Rate controlled without the use of any medication    Dehydration - Resolved with IV fluids    Chronic kidney disease stage III - Monitor electrolytes periodically  Disposition: Remain inpatient-disposition to re-evaluated on Monday  DVT Prophylaxis: Prophylactic Lovenox   Code Status: DNR  Family Communication Daughter at bedside  Procedures:  None  CONSULTS:  None   MEDICATIONS: Scheduled Meds: . amLODipine  5 mg Oral Daily  . atorvastatin  20 mg Oral q1800  . clopidogrel  75 mg Oral Q breakfast  . enoxaparin (LOVENOX) injection  30 mg Subcutaneous Q24H  . hydrALAZINE   25 mg Oral TID WC & HS  . meclizine  12.5 mg Oral BID  . multivitamin with minerals  1 tablet Oral Q lunch  . sodium chloride  3 mL Intravenous Q12H   Continuous Infusions:  PRN Meds:.acetaminophen, acetaminophen, alum & mag hydroxide-simeth, HYDROcodone-acetaminophen, HYDROmorphone (DILAUDID) injection, ondansetron (ZOFRAN) IV, ondansetron  Antibiotics: Anti-infectives   None       PHYSICAL EXAM: Vital signs in last 24 hours: Filed Vitals:   04/26/13 1810 04/26/13 2217 04/27/13 0541 04/27/13 0900  BP: 146/62 118/55 179/65 162/42  Pulse:  48 55   Temp:  98 F (36.7 C) 97.8 F (36.6 C)   TempSrc:  Oral Oral   Resp:  18 18   Height:      Weight:      SpO2:  93% 90%     Weight change:  Filed Weights   04/25/13 1740  Weight: 47.628 kg (105 lb)   Body mass index is 19.2 kg/(m^2).   Gen Exam: Awake and alert with clear speech.   Neck: Supple, No JVD.   Chest: B/L Clear.   CVS: S1 S2 Regular, no murmurs.  Abdomen: soft, BS +, non tender, non distended.  Extremities: no edema, lower extremities warm to touch. Neurologic: Non Focal.   Skin: No Rash.   Wounds: N/A.    Intake/Output from previous day:  Intake/Output Summary (Last 24 hours) at 04/27/13 0959 Last data filed at 04/27/13 0940  Gross per 24 hour  Intake    483 ml  Output      1 ml  Net    482 ml  LAB RESULTS: CBC  Recent Labs Lab 04/25/13 1429 04/25/13 1942 04/26/13 0735  WBC 7.9 5.1 5.6  HGB 14.1 13.9 12.9  HCT 40.8 41.2 40.0  PLT 142* 147* 141*  MCV 95.8 96.0 97.6  MCH 33.1 32.4 31.5  MCHC 34.6 33.7 32.3  RDW 14.4 14.3 14.6  LYMPHSABS 0.3*  --   --   MONOABS 0.3  --   --   EOSABS 0.0  --   --   BASOSABS 0.0  --   --     Chemistries   Recent Labs Lab 04/25/13 1429 04/25/13 1942 04/26/13 0735  NA 140  --  143  K 4.3  --  4.2  CL 99  --  103  CO2 29  --  30  GLUCOSE 174*  --  88  BUN 28*  --  26*  CREATININE 1.40* 1.26* 1.45*  CALCIUM 9.7  --  9.2    CBG: No results  found for this basename: GLUCAP,  in the last 168 hours  GFR Estimated Creatinine Clearance: 15.9 ml/min (by C-G formula based on Cr of 1.45).  Coagulation profile No results found for this basename: INR, PROTIME,  in the last 168 hours  Cardiac Enzymes  Recent Labs Lab 04/25/13 1929 04/26/13 0123 04/26/13 0735  TROPONINI <0.30 <0.30 <0.30    No components found with this basename: POCBNP,  No results found for this basename: DDIMER,  in the last 72 hours No results found for this basename: HGBA1C,  in the last 72 hours  Recent Labs  04/27/13 0430  CHOL 167  HDL 60  LDLCALC 96  TRIG 57  CHOLHDL 2.8   No results found for this basename: TSH, T4TOTAL, FREET3, T3FREE, THYROIDAB,  in the last 72 hours No results found for this basename: VITAMINB12, FOLATE, FERRITIN, TIBC, IRON, RETICCTPCT,  in the last 72 hours No results found for this basename: LIPASE, AMYLASE,  in the last 72 hours  Urine Studies No results found for this basename: UACOL, UAPR, USPG, UPH, UTP, UGL, UKET, UBIL, UHGB, UNIT, UROB, ULEU, UEPI, UWBC, URBC, UBAC, CAST, CRYS, UCOM, BILUA,  in the last 72 hours  MICROBIOLOGY: No results found for this or any previous visit (from the past 240 hour(s)).  RADIOLOGY STUDIES/RESULTS: Ct Head Wo Contrast  04/25/2013   *RADIOLOGY REPORT*  Clinical Data: Dizziness.  Nausea and vomiting  CT HEAD WITHOUT CONTRAST  Technique:  Contiguous axial images were obtained from the base of the skull through the vertex without contrast.  Comparison: None.  Findings: Generalized atrophy.  Chronic right parietal cortical infarct.  Mild chronic microvascular ischemic change.  Small chronic infarct left cerebellum.  Negative for acute infarct, hemorrhage, or mass lesion.  Negative for skull lesion.  IMPRESSION: Chronic right parietal infarct.  Small chronic infarct left cerebellum.  No acute abnormality.   Original Report Authenticated By: Janeece Riggers, M.D.   Mr Brain Wo  Contrast  04/25/2013   *RADIOLOGY REPORT*  Clinical Data: Evaluate for CVA.  MRI HEAD WITHOUT CONTRAST  Technique:  Multiplanar, multiecho pulse sequences of the brain and surrounding structures were obtained according to standard protocol without intravenous contrast.  Comparison: Head CT from the same day.  Findings:  Calvarium and upper cervical spine: No marrow signal abnormality.  Orbits: Bilateral cataract resection.  Sinuses: Clear. Mastoid and middle ears are clear.  Brain: Restricted diffusion within the inferior, medial right cerebellum, posterior inferior cerebellar artery territory, with some sparing of the lateral cerebellum.  Remote infarcts  in the peripheral left cerebellum and right posterior border region (affecting the parietal lobes predominately).  Abnormal vertebral arteries: Loss of normal flow related signal loss within the right V3 segment, with poor or no flow in the right V4 segment. The left vertebral artery remains patent.  Global atrophy.  Negative for hydrocephalus, shift, or evidence of mass.  Critical Value/emergent results were called by telephone at the time of interpretation on 04/25/2013 at 23:30 to [facility], who verbally acknowledged these results.  IMPRESSION: 1.  Acute right posterior inferior cerebellar artery territory infarction.  Distal right vertebral artery occlusion (more likely) or slow flow. 2.  Remote ischemic injuries to the right parietal lobe and left cerebellum.   Original Report Authenticated By: Cynda Acres, MD  [facility] Pager:[phone]  If 7PM-7AM, please contact night-coverage [email or web address] Password [email or web address] 04/27/2013, 9:59 AM   LOS: 2 days

## 2013-04-27 NOTE — Progress Notes (Signed)
Clinical Social Work Department BRIEF PSYCHOSOCIAL ASSESSMENT 04/27/2013  Patient:  Debbie Spears, Debbie Spears     Account Number:  [id number]     Admit date:  04/25/2013  Clinical Social Worker:  Doree Albee  Date/Time:  04/27/2013 10:12 AM  Referred by:  Physician  Date Referred:  04/27/2013 Referred for  SNF Placement   Other Referral:   Interview type:  Patient Other interview type:   patient daughter    PSYCHOSOCIAL DATA Living Status:  ALONE Admitted from facility:   Level of care:   Primary support name:  Debbie Spears Primary support relationship to patient:  CHILD, ADULT Degree of support available:   Strong, at bedside    CURRENT CONCERNS Current Concerns  Post-Acute Placement   Other Concerns:    SOCIAL WORK ASSESSMENT / PLAN CSW recieved consult from physician regarding pt disposition needs. Pt is awaiting CIR evaluation on Monday, however physician wanted to discuss other possible options such as skilled nursing and home health depending upon recommondations.    CSW met with pt and pt daughter at bedside. patient is very drowsy and unable to fully engage in conversation. CSW spoke with patient daughter regarding physician referral. Patient daughter shared that they definitely wanted patient to go to rehab either at [facility] or a skilled nursing facility. Patient daughter interested in [place] skilled nursing search for back up options. Pt and pt daughter first choices are [facility], [facility], and [facility]. CSW will initiate snf search as a back up plan.    Pt daughter requested information for assisted living, private duty nursing, and home health agency list for furture resource. CSW provided these resources. Patient daughter is concerned about the amount of care patient will need after completing rehab at [facility] or SNF.   Assessment/plan status:  Psychosocial Support/Ongoing Assessment of Needs Other assessment/ plan:    Information/referral to community resources:   Skilled Nursing facility list  Assisted LIving list  [facility] agency list  Private duty care last    PATIENT'S/FAMILY'S RESPONSE TO PLAN OF CARE: Patient and pt daughter thanked csw for concern and support. Pt duaghter is hopeful for patient to go to CIR or SNF for short term rehab.     Catha Gosselin, LCSW [phone]  ED CSW .04/27/2013 1045am

## 2013-04-27 NOTE — Progress Notes (Addendum)
Clinical Social Work Department CLINICAL SOCIAL WORK PLACEMENT NOTE 04/27/2013  Patient:  Debbie Spears, Debbie Spears  Account Number:  [id number] Admit date:  04/25/2013  Clinical Social Worker:  Doree Albee  Date/time:  04/27/2013 03:38 PM  Clinical Social Work is seeking post-discharge placement for this patient at the following level of care:   SKILLED NURSING   (*CSW will update this form in Epic as items are completed)   04/27/2013  Patient/family provided with [facility] Department of Clinical Social Work's list of facilities offering this level of care within the geographic area requested by the patient (or if unable, by the patient's family).  04/27/2013  Patient/family informed of their freedom to choose among providers that offer the needed level of care, that participate in Medicare, Medicaid or managed care program needed by the patient, have an available bed and are willing to accept the patient.  04/27/2013  Patient/family informed of [facility]' ownership interest in [facility], as well as of the fact that they are under no obligation to receive care at this facility.  PASARR submitted to EDS on 04/27/2013 PASARR number received from EDS on 04/27/2013  FL2 transmitted to all facilities in geographic area requested by pt/family on  04/27/2013 FL2 transmitted to all facilities within larger geographic area on   Patient informed that his/her managed care company has contracts with or will negotiate with  certain facilities, including the following:     Patient/family informed of bed offers received:  04/29/2013 Patient chooses bed at [facility] Physician recommends and patient chooses bed at    Patient to be transferred to [facility] on 04/29/2013   Patient to be transferred to facility by [facility] The following physician request were entered in Epic:   Additional Comments:  .Catha Gosselin, LCSW weekend covering [phone] .04/27/2013 1619

## 2013-04-28 MED ORDER — ALBUTEROL SULFATE (5 MG/ML) 0.5% IN NEBU: 2.5000 mg | INHALATION_SOLUTION | RESPIRATORY_TRACT | Status: DC | PRN

## 2013-04-28 MED ORDER — POTASSIUM CHLORIDE CRYS ER 20 MEQ PO TBCR
20.0000 meq | EXTENDED_RELEASE_TABLET | Freq: Every day | ORAL | Status: DC
  Administered 2013-04-28 – 2013-04-29 (×2): 20 meq via ORAL
  Filled 2013-04-28 (×2): qty 1

## 2013-04-28 MED ORDER — FUROSEMIDE 10 MG/ML IJ SOLN
40.0000 mg | Freq: Once | INTRAMUSCULAR | Status: AC
  Administered 2013-04-28: 40 mg via INTRAVENOUS
  Filled 2013-04-28: qty 4

## 2013-04-28 NOTE — Progress Notes (Signed)
PATIENT DETAILS Name: Debbie Spears Age: 79 y.o. Sex: female Date of Birth: [birth year] Admit Date: 04/25/2013 Admitting Physician Ripudeep Jenna Luo, MD [id number], [facility], MD  Subjective: Vertigo better-but still "woozy" when she walks, nausea resolved.  Assessment/Plan: Principal Problem:   Vertigo - Secondary to acute right cerebellar CVA - Somewhat better, seen by physical therapy-appreciate CIR evaluation.Still unsteady on her feet-lives alone, will need to monitor progress over the weekend, and re-evaluate on Monday for SNF vs CIR vs HHPT  Active Problems: Acute CVA - MRI brain positive for acute right cerebellar CVA - Given advanced age, I see no point in undergoing a full CVA workup-as it would not change management. I have spoken with the patient's daughter Randa Evens) at bedside, she does understand and is agreeable with no further workup at this point.She was not a candidate for TPA given delayed diagnosis of CVA given atypical presentation. - Since she was already on aspirin prior to this CVA, we will switch her over to Plavix, and statin - I have discontinued the order for 2-D echocardiogram, as noted above, will not order a carotid ultrasound.    HYPERLIPIDEMIA - check LDL, add statin    HYPERTENSION - Continue with amlodipine and hydralazine-fluctuating-monitor for 1 more day-before adjusting    Atrial fibrillation - Seen by [facility] cardiology in the past-not a candidate for anticoagulation. - Rate controlled without the use of any medication    Dehydration - Resolved with IV fluids    Chronic kidney disease stage III - Monitor electrolytes periodically  Disposition: Remain inpatient-disposition to re-evaluated on Monday. Options are CIR vs SNF vs HHPT  DVT Prophylaxis: Prophylactic Lovenox   Code Status: DNR  Family Communication Daughter at bedside  Procedures:  None  CONSULTS:  None   MEDICATIONS: Scheduled Meds: . amLODipine  5 mg Oral  Daily  . atorvastatin  20 mg Oral q1800  . clopidogrel  75 mg Oral Q breakfast  . docusate sodium  100 mg Oral BID  . enoxaparin (LOVENOX) injection  30 mg Subcutaneous Q24H  . hydrALAZINE  25 mg Oral TID WC & HS  . meclizine  12.5 mg Oral BID  . multivitamin with minerals  1 tablet Oral Q lunch  . sodium chloride  3 mL Intravenous Q12H   Continuous Infusions:  PRN Meds:.acetaminophen, acetaminophen, alum & mag hydroxide-simeth, HYDROcodone-acetaminophen, HYDROmorphone (DILAUDID) injection, ondansetron (ZOFRAN) IV, ondansetron  Antibiotics: Anti-infectives   None       PHYSICAL EXAM: Vital signs in last 24 hours: Filed Vitals:   04/27/13 2100 04/27/13 2130 04/28/13 0517 04/28/13 0738  BP: 130/52  158/49 167/51  Pulse: 61  61   Temp: 98.1 F (36.7 C)  97.8 F (36.6 C)   TempSrc: Oral  Oral   Resp: 16  16   Height:      Weight:      SpO2: 93% 99% 96%     Weight change:  Filed Weights   04/25/13 1740  Weight: 47.628 kg (105 lb)   Body mass index is 19.2 kg/(m^2).   Gen Exam: Awake and alert with clear speech.   Neck: Supple, No JVD.   Chest: B/L Clear.   CVS: S1 S2 Regular, no murmurs.  Abdomen: soft, BS +, non tender, non distended.  Extremities: no edema, lower extremities warm to touch. Neurologic: Non Focal.   Skin: No Rash.   Wounds: N/A.    Intake/Output from previous day:  Intake/Output Summary (Last 24 hours) at 04/28/13 4098 Last data  filed at 04/27/13 1700  Gross per 24 hour  Intake    480 ml  Output      1 ml  Net    479 ml     LAB RESULTS: CBC  Recent Labs Lab 04/25/13 1429 04/25/13 1942 04/26/13 0735  WBC 7.9 5.1 5.6  HGB 14.1 13.9 12.9  HCT 40.8 41.2 40.0  PLT 142* 147* 141*  MCV 95.8 96.0 97.6  MCH 33.1 32.4 31.5  MCHC 34.6 33.7 32.3  RDW 14.4 14.3 14.6  LYMPHSABS 0.3*  --   --   MONOABS 0.3  --   --   EOSABS 0.0  --   --   BASOSABS 0.0  --   --     Chemistries   Recent Labs Lab 04/25/13 1429 04/25/13 1942  04/26/13 0735  NA 140  --  143  K 4.3  --  4.2  CL 99  --  103  CO2 29  --  30  GLUCOSE 174*  --  88  BUN 28*  --  26*  CREATININE 1.40* 1.26* 1.45*  CALCIUM 9.7  --  9.2    CBG: No results found for this basename: GLUCAP,  in the last 168 hours  GFR Estimated Creatinine Clearance: 15.9 ml/min (by C-G formula based on Cr of 1.45).  Coagulation profile No results found for this basename: INR, PROTIME,  in the last 168 hours  Cardiac Enzymes  Recent Labs Lab 04/25/13 1929 04/26/13 0123 04/26/13 0735  TROPONINI <0.30 <0.30 <0.30    No components found with this basename: POCBNP,  No results found for this basename: DDIMER,  in the last 72 hours No results found for this basename: HGBA1C,  in the last 72 hours  Recent Labs  04/27/13 0430  CHOL 167  HDL 60  LDLCALC 96  TRIG 57  CHOLHDL 2.8   No results found for this basename: TSH, T4TOTAL, FREET3, T3FREE, THYROIDAB,  in the last 72 hours No results found for this basename: VITAMINB12, FOLATE, FERRITIN, TIBC, IRON, RETICCTPCT,  in the last 72 hours No results found for this basename: LIPASE, AMYLASE,  in the last 72 hours  Urine Studies No results found for this basename: UACOL, UAPR, USPG, UPH, UTP, UGL, UKET, UBIL, UHGB, UNIT, UROB, ULEU, UEPI, UWBC, URBC, UBAC, CAST, CRYS, UCOM, BILUA,  in the last 72 hours  MICROBIOLOGY: No results found for this or any previous visit (from the past 240 hour(s)).  RADIOLOGY STUDIES/RESULTS: Ct Head Wo Contrast  04/25/2013   *RADIOLOGY REPORT*  Clinical Data: Dizziness.  Nausea and vomiting  CT HEAD WITHOUT CONTRAST  Technique:  Contiguous axial images were obtained from the base of the skull through the vertex without contrast.  Comparison: None.  Findings: Generalized atrophy.  Chronic right parietal cortical infarct.  Mild chronic microvascular ischemic change.  Small chronic infarct left cerebellum.  Negative for acute infarct, hemorrhage, or mass lesion.  Negative for skull  lesion.  IMPRESSION: Chronic right parietal infarct.  Small chronic infarct left cerebellum.  No acute abnormality.   Original Report Authenticated By: Janeece Riggers, M.D.   Mr Brain Wo Contrast  04/25/2013   *RADIOLOGY REPORT*  Clinical Data: Evaluate for CVA.  MRI HEAD WITHOUT CONTRAST  Technique:  Multiplanar, multiecho pulse sequences of the brain and surrounding structures were obtained according to standard protocol without intravenous contrast.  Comparison: Head CT from the same day.  Findings:  Calvarium and upper cervical spine: No marrow signal abnormality.  Orbits: Bilateral  cataract resection.  Sinuses: Clear. Mastoid and middle ears are clear.  Brain: Restricted diffusion within the inferior, medial right cerebellum, posterior inferior cerebellar artery territory, with some sparing of the lateral cerebellum.  Remote infarcts in the peripheral left cerebellum and right posterior border region (affecting the parietal lobes predominately).  Abnormal vertebral arteries: Loss of normal flow related signal loss within the right V3 segment, with poor or no flow in the right V4 segment. The left vertebral artery remains patent.  Global atrophy.  Negative for hydrocephalus, shift, or evidence of mass.  Critical Value/emergent results were called by telephone at the time of interpretation on 04/25/2013 at 23:30 to [facility], who verbally acknowledged these results.  IMPRESSION: 1.  Acute right posterior inferior cerebellar artery territory infarction.  Distal right vertebral artery occlusion (more likely) or slow flow. 2.  Remote ischemic injuries to the right parietal lobe and left cerebellum.   Original Report Authenticated By: Cynda Acres, MD  [facility] Pager:[phone]  If 7PM-7AM, please contact night-coverage [email or web address] Password [facility] 04/28/2013, 9:37 AM   LOS: 3 days

## 2013-04-29 ENCOUNTER — Inpatient Hospital Stay (HOSPITAL_COMMUNITY): Payer: Medicare Other

## 2013-04-29 MED ORDER — ATORVASTATIN CALCIUM 20 MG PO TABS: 20.0000 mg | ORAL_TABLET | Freq: Every day | ORAL | Status: DC

## 2013-04-29 MED ORDER — BENAZEPRIL HCL 20 MG PO TABS
20.0000 mg | ORAL_TABLET | Freq: Every day | ORAL | Status: DC
  Administered 2013-04-29: 20 mg via ORAL
  Filled 2013-04-29: qty 1

## 2013-04-29 MED ORDER — CLOPIDOGREL BISULFATE 75 MG PO TABS: 75.0000 mg | ORAL_TABLET | Freq: Every day | ORAL | Status: DC

## 2013-04-29 MED ORDER — FUROSEMIDE 40 MG PO TABS
40.0000 mg | ORAL_TABLET | Freq: Every day | ORAL | Status: DC
  Administered 2013-04-29: 40 mg via ORAL
  Filled 2013-04-29: qty 1

## 2013-04-29 MED ORDER — ALBUTEROL SULFATE (5 MG/ML) 0.5% IN NEBU: 2.5000 mg | INHALATION_SOLUTION | RESPIRATORY_TRACT | Status: DC | PRN

## 2013-04-29 NOTE — Progress Notes (Signed)
CSW informed by [name] representative that all paperwork is complete with family. Pt nurse informed and confirmed transport can be arranged. CSW arranged transport with [facility] CSW signing off.   Laiba Fuerte, LCSWA [phone]

## 2013-04-29 NOTE — Progress Notes (Signed)
Occupational Therapy Treatment Patient Details Name: Debbie Spears MRN: [id number] DOB: [birth year] Today's Date: 04/29/2013 Time: 1410-1435 OT Time Calculation (min): 25 min  OT Assessment / Plan / Recommendation  History of present illness pt presents with hx of suddne onset dizziness lasting until pt arrived to ED.  MRI showed R Cerebellar Infarct and Vertebral Occlusion.     OT comments  Pt is progressing in mobility and ADL.  Continues to have some dizziness and instability on her feet placing her at risk for falls and impeding her ability to return to her prior independence level.  Pt has excellent rehab potential and a supportive family.  Follow Up Recommendations  CIR    Barriers to Discharge       Equipment Recommendations  None recommended by OT    Recommendations for Other Services Rehab consult  Frequency Min 2X/week   Progress towards OT Goals Progress towards OT goals: Progressing toward goals  Plan Discharge plan remains appropriate    Precautions / Restrictions Precautions Precautions: Fall Precaution Comments: dizziness Restrictions Weight Bearing Restrictions: No   Pertinent Vitals/Pain No pain, VSS    ADL  Eating/Feeding: Set up Where Assessed - Eating/Feeding: Chair Grooming: Minimal assistance;Wash/dry hands Where Assessed - Grooming: Unsupported standing Upper Body Dressing: Supervision/safety Where Assessed - Upper Body Dressing: Unsupported sitting Lower Body Dressing: Minimal assistance Where Assessed - Lower Body Dressing: Unsupported sitting;Supported sit to stand Toilet Transfer: Minimal assistance Toilet Transfer Method: Sit to stand Toilet Transfer Equipment: Regular height toilet Toileting - Clothing Manipulation and Hygiene: Minimal assistance Where Assessed - Engineer, mining and Hygiene: Sit to stand from 3-in-1 or toilet Equipment Used: Rolling walker Transfers/Ambulation Related to ADLs: mod assist for ambulation  with RW with 1 LOB. ADL Comments: Pt was canning green beans the day prior to her CVA.  Ambulated with a cane outside of home.    OT Diagnosis:    OT Problem List:   OT Treatment Interventions:     OT Goals(current goals can now be found in the care plan section) Acute Rehab OT Goals Patient Stated Goal: Back to normal  Visit Information  Last OT Received On: 04/29/13 Assistance Needed: +1 PT/OT Co-Evaluation/Treatment: Yes History of Present Illness: pt presents with hx of suddne onset dizziness lasting until pt arrived to ED.  MRI showed R Cerebellar Infarct and Vertebral Occlusion.      Subjective Data      Prior Functioning       Cognition  Cognition Arousal/Alertness: Awake/alert Behavior During Therapy: WFL for tasks assessed/performed (pt very quiet in nature, does not speak much unless spoken 2) Overall Cognitive Status: Impaired/Different from baseline Area of Impairment: Memory;Orientation Orientation Level: Time Memory: Decreased short-term memory General Comments: decreased recall of time wihout min verbal cues.  With cues she is able to correct.  When first asked the month she reported "January"    Mobility  Bed Mobility Bed Mobility: Supine to Sit;Sitting - Scoot to Edge of Bed Supine to Sit: 4: Min assist;With rails;HOB flat Sitting - Scoot to [place] of Bed: 4: Min assist;With rail Details for Bed Mobility Assistance: min assist to support trunk for balance during transition to sitting EOB.  Heavy reliance on railing for support.   Transfers Transfers: Sit to Stand;Stand to Sit Sit to Stand: 4: Min assist;With upper extremity assist;From bed Stand to Sit: 4: Min assist;With upper extremity assist;To chair/3-in-1 Details for Transfer Assistance: pt uses gaze stabilization during ambulation    Exercises  Balance Balance Balance Assessed: Yes Dynamic Sitting Balance Dynamic Sitting - Balance Support: Feet supported;No upper extremity supported;During  functional activity (donning and doffing socks) Dynamic Sitting - Level of Assistance: 5: Stand by assistance   End of Session OT - End of Session Activity Tolerance: Patient tolerated treatment well Patient left: in chair;with call bell/phone within reach;with family/visitor present  GO     Evern Bio 04/29/2013, 2:41 PM [phone]

## 2013-04-29 NOTE — Progress Notes (Signed)
I met with patient and her daughter at bedside. I await updated PT noted today to assist in determining inpt rehab admission vs SNF today. [phone]

## 2013-04-29 NOTE — Discharge Summary (Addendum)
PATIENT DETAILS Name: Debbie Spears Age: 79 y.o. Sex: female Date of Birth: [birth year] MRN: [id number]. Admit Date: 04/25/2013 Admitting Physician: Cathren Harsh, MD WUJ:[id number], [facility], MD  Recommendations for Outpatient Follow-up:  1. Please check CBC in SNF periodically. 2. High fall risk, will require fall precautions.  PRIMARY DISCHARGE DIAGNOSIS:  Principal Problem:   Vertigo Active Problems:   HYPERLIPIDEMIA   HYPERTENSION   Atrial fibrillation   Dehydration   Acute kidney injury   Acute CVA (cerebrovascular accident)      PAST MEDICAL HISTORY: Past Medical History  Diagnosis Date  . Hypertension   . Hyperlipidemia   . Glucose intolerance (impaired glucose tolerance)   . Osteoporosis   . Osteoarthritis   . Atrial fibrillation   . Diastolic CHF, acute     DISCHARGE MEDICATIONS:   Medication List    STOP taking these medications       aspirin EC 325 MG tablet      TAKE these medications       albuterol (5 MG/ML) 0.5% nebulizer solution  Commonly known as:  PROVENTIL  Take 0.5 mL (2.5 mg total) by nebulization every 2 (two) hours as needed for wheezing or shortness of breath.     amLODipine 5 MG tablet  Commonly known as:  NORVASC  Take 1 tablet (5 mg total) by mouth daily.     atorvastatin 20 MG tablet  Commonly known as:  LIPITOR  Take 1 tablet (20 mg total) by mouth daily at 6 PM.     benazepril 20 MG tablet  Commonly known as:  LOTENSIN  Take 20 mg by mouth daily.     CALCIUM PO  Take 1 tablet by mouth daily with lunch.     clopidogrel 75 MG tablet  Commonly known as:  PLAVIX  Take 1 tablet (75 mg total) by mouth daily with breakfast.     docusate sodium 100 MG capsule  Commonly known as:  COLACE  Take 100-200 mg by mouth See admin instructions. Take 1 tablet every day at 5pm with dinner (take 2 tablets for severe constipation)     furosemide 40 MG tablet  Commonly known as:  LASIX  Take 1 tablet (40 mg total) by mouth daily.     hydrALAZINE 25 MG tablet  Commonly known as:  APRESOLINE  Take 25 mg by mouth 4 (four) times daily. 8am, 12pm, 5pm, 10pm     multivitamin with minerals Tabs tablet  Take 1 tablet by mouth daily with lunch. Viactiv chewables     potassium chloride SA 20 MEQ tablet  Commonly known as:  KLOR-CON M20  Take 1 tablet (20 mEq total) by mouth daily.        ALLERGIES:   Allergies  Allergen Reactions  . Olmesartan Medoxomil Diarrhea and Nausea And Vomiting    BRIEF HPI:  See H&P, Labs, Consult and Test reports for all details in brief, patient Patient is a 79 year old female with a history of hypertension, hyperlipidemia, chronic diastolic CHF, atrial fibrillation (not a Coumadin candidate per Dr. Tenny Craw) has a history of prior cerebellar CVA was brought by family for persistent dizziness that started on the morning of admission.  CONSULTATIONS:   None  PERTINENT RADIOLOGIC STUDIES: Ct Head Wo Contrast  04/25/2013   *RADIOLOGY REPORT*  Clinical Data: Dizziness.  Nausea and vomiting  CT HEAD WITHOUT CONTRAST  Technique:  Contiguous axial images were obtained from the base of the skull through the vertex without contrast.  Comparison: None.  Findings: Generalized atrophy.  Chronic right parietal cortical infarct.  Mild chronic microvascular ischemic change.  Small chronic infarct left cerebellum.  Negative for acute infarct, hemorrhage, or mass lesion.  Negative for skull lesion.  IMPRESSION: Chronic right parietal infarct.  Small chronic infarct left cerebellum.  No acute abnormality.   Original Report Authenticated By: Janeece Riggers, M.D.   Mr Brain Wo Contrast  04/25/2013   *RADIOLOGY REPORT*  Clinical Data: Evaluate for CVA.  MRI HEAD WITHOUT CONTRAST  Technique:  Multiplanar, multiecho pulse sequences of the brain and surrounding structures were obtained according to standard protocol without intravenous contrast.  Comparison: Head CT from the same day.  Findings:  Calvarium and upper cervical  spine: No marrow signal abnormality.  Orbits: Bilateral cataract resection.  Sinuses: Clear. Mastoid and middle ears are clear.  Brain: Restricted diffusion within the inferior, medial right cerebellum, posterior inferior cerebellar artery territory, with some sparing of the lateral cerebellum.  Remote infarcts in the peripheral left cerebellum and right posterior border region (affecting the parietal lobes predominately).  Abnormal vertebral arteries: Loss of normal flow related signal loss within the right V3 segment, with poor or no flow in the right V4 segment. The left vertebral artery remains patent.  Global atrophy.  Negative for hydrocephalus, shift, or evidence of mass.  Critical Value/emergent results were called by telephone at the time of interpretation on 04/25/2013 at 23:30 to [facility], who verbally acknowledged these results.  IMPRESSION: 1.  Acute right posterior inferior cerebellar artery territory infarction.  Distal right vertebral artery occlusion (more likely) or slow flow. 2.  Remote ischemic injuries to the right parietal lobe and left cerebellum.   Original Report Authenticated By: Tiburcio Pea   Dg Chest Port 1 View  04/29/2013   *RADIOLOGY REPORT*  Clinical Data: Shortness of breath.  PORTABLE CHEST - 1 VIEW  Comparison: 05/09/2010.  Findings: Cardiomegaly.  Right-sided pleural effusion.  Opacification right base may represent pleural fluid with atelectasis although limited for evaluating for possibility of underlying infiltrate or mass.  Pulmonary vascular prominence most notable centrally.  Calcified tortuous aorta.  No gross pneumothorax.  IMPRESSION: Cardiomegaly.  Right-sided pleural effusion.  Opacification right base may represent pleural fluid with atelectasis although limited for evaluating for possibility of underlying infiltrate or mass.  Pulmonary vascular prominence most notable centrally.  Calcified tortuous aorta.   Original Report Authenticated By: Lacy Duverney,  M.D.     PERTINENT LAB RESULTS: CBC: No results found for this basename: WBC, HGB, HCT, PLT,  in the last 72 hours CMET CMP     Component Value Date/Time   NA 143 04/26/2013 0735   K 4.2 04/26/2013 0735   CL 103 04/26/2013 0735   CO2 30 04/26/2013 0735   GLUCOSE 88 04/26/2013 0735   BUN 26* 04/26/2013 0735   CREATININE 1.45* 04/26/2013 0735   CALCIUM 9.2 04/26/2013 0735   PROT 6.5 04/25/2013 1429   ALBUMIN 3.7 04/25/2013 1429   AST 32 04/25/2013 1429   ALT 15 04/25/2013 1429   ALKPHOS 79 04/25/2013 1429   BILITOT 0.3 04/25/2013 1429   GFRNONAA 29* 04/26/2013 0735   GFRAA 33* 04/26/2013 0735    GFR Estimated Creatinine Clearance: 16.2 ml/min (by C-G formula based on Cr of 1.45). No results found for this basename: LIPASE, AMYLASE,  in the last 72 hours No results found for this basename: CKTOTAL, CKMB, CKMBINDEX, TROPONINI,  in the last 72 hours No components found with this basename: POCBNP,  No results found  for this basename: DDIMER,  in the last 72 hours No results found for this basename: HGBA1C,  in the last 72 hours  Recent Labs  04/27/13 0430  CHOL 167  HDL 60  LDLCALC 96  TRIG 57  CHOLHDL 2.8   No results found for this basename: TSH, T4TOTAL, FREET3, T3FREE, THYROIDAB,  in the last 72 hours No results found for this basename: VITAMINB12, FOLATE, FERRITIN, TIBC, IRON, RETICCTPCT,  in the last 72 hours Coags: No results found for this basename: PT, INR,  in the last 72 hours Microbiology: No results found for this or any previous visit (from the past 240 hour(s)).   BRIEF HOSPITAL COURSE:  Vertigo  - Secondary to acute right cerebellar CVA - Better, but not yet back to baseline-suspect she will need SNF on discharge.  Acute CVA  - On admission, it was felt that she had peripheral vertigo, CT of the head was negative for significant abnormalities. She had no focal neurological deficits on exam, a subsequent MRI brain positive for acute right cerebellar CVA . - Given  advanced age, I see no point in undergoing a full CVA workup-as it would not change management. I have spoken with the patient's daughter Debbie Spears) at bedside, she does understand and is agreeable with no further workup at this point.She was not a candidate for TPA given delayed diagnosis of CVA given atypical presentation.  - Since she was already on aspirin prior to this CVA, we will switch her over to Plavix, and statin  - I have discontinued the order for 2-D echocardiogram, as noted above, will not order a carotid ultrasound.  HYPERLIPIDEMIA  -  LDL was 90, continue with statin on discharge  TODAY-DAY OF DISCHARGE:  Subjective:   Debbie Spears today has no headache,no chest abdominal pain,no new weakness tingling or numbness, feels much better.  Objective:   Blood pressure 166/42, pulse 53, temperature 98.1 F (36.7 C), temperature source Oral, resp. rate 16, height 5\' 2"  (1.575 m), weight 48.49 kg (106 lb 14.4 oz), SpO2 98.00%.  Intake/Output Summary (Last 24 hours) at 04/29/13 1017 Last data filed at 04/29/13 0800  Gross per 24 hour  Intake    120 ml  Output      0 ml  Net    120 ml   Filed Weights   04/25/13 1740 04/28/13 2030  Weight: 47.628 kg (105 lb) 48.49 kg (106 lb 14.4 oz)    Exam Awake Alert, Oriented *3, No new F.N deficits, Normal affect [place].AT,PERRAL Supple Neck,No JVD, No cervical lymphadenopathy appriciated.  Symmetrical Chest wall movement, Good air movement bilaterally, CTAB RRR,No Gallops,Rubs or new Murmurs, No Parasternal Heave +ve B.Sounds, Abd Soft, Non tender, No organomegaly appriciated, No rebound -guarding or rigidity. No Cyanosis, Clubbing or edema, No new Rash or bruise  DISCHARGE CONDITION: Stable  DISPOSITION: SNF   DISCHARGE INSTRUCTIONS:    Activity:  As tolerated with Full fall precautions use walker/cane & assistance as needed  Diet recommendation: Heart Healthy diet  CODE STATUS DNR       Discharge Orders   Future  Appointments Provider Department Dept Phone   05/08/2013 1:30 PM Sherrie George, MD [facility] [phone]   06/10/2013 12:30 PM Pricilla Riffle, MD [facility] Main [facility] [place]) [phone]   Future Orders Complete By Expires   Call MD for:  persistant dizziness or light-headedness  As directed    Diet - low sodium heart healthy  As directed  Increase activity slowly  As directed       Follow-up Information   Follow up with [facility], [facility], MD In 1 week.   Specialty:  Internal Medicine   Contact information:   [address] [place] [address] [phone]       Follow up with Dietrich Pates, MD. (keep next appt)    Specialty:  Cardiology   Contact information:   [address] [place] [address] [phone]      Total Time spent on discharge equals 45 minutes.  SignedJeoffrey Massed 04/29/2013 10:17 AM

## 2013-04-29 NOTE — Progress Notes (Signed)
CSW spoke with [name] and they are able to offer a bed today. CSW informed pt family that pt ready to dc and has bed at [facility] today if pt is not accepted by CIR. Pt family agreeable if pt is not suitable for CIR. CSW awaiting CIR decision and will set up discharge if necessary.  Demarqus Jocson, LCSWA [phone]

## 2013-04-29 NOTE — Progress Notes (Signed)
CSW informed that pt family would like to proceed with SNF placement. CSW notified [name] who will have representative come to pt room to complete paperwork with family. Pt dc packet placed with shadow chart. CSW to arrange transport when paperwork is completed with family. Pt nurse notified.  Theta Leaf, LCSWA [phone]

## 2013-04-29 NOTE — Progress Notes (Signed)
Physical Therapy Treatment Patient Details Name: Debbie Spears MRN: [id number] DOB: [birth year] Today's Date: 04/29/2013 Time: 4696-2952 PT Time Calculation (min): 26 min  PT Assessment / Plan / Recommendation  History of Present Illness pt presents with hx of suddne onset dizziness lasting until pt arrived to ED.  MRI showed R Cerebellar Infarct and Vertebral Occlusion.     PT Comments   Pt's daughter reports PTA she was independent with a SPC walking ~4 times as far as she did today, standing to can 7 quarts of beans and generally taking care of herself.  She was cooking full meals for the family and herself.  It is for this reason, I believe despite her advanced age that she continues to be an excellent inpatient rehab candidate.  I believe she has great potential to get back to a mod I level with family's intermittent supervision.  I also believe she can tolerate the intensity of inpatient rehab because of her physical baseline was so good and she is tolerating treatment well acutely.    Follow Up Recommendations  CIR     Does the patient have the potential to tolerate intense rehabilitation   Yes  Barriers to Discharge   None      Equipment Recommendations  None recommended by PT    Recommendations for Other Services Rehab consult  Frequency Min 4X/week   Progress towards PT Goals Progress towards PT goals: Progressing toward goals  Plan Current plan remains appropriate    Precautions / Restrictions Precautions Precautions: Fall Precaution Comments: dizziness Restrictions Weight Bearing Restrictions: No   Pertinent Vitals/Pain Mild HA, mild dizziness while working with PT/OT    Mobility  Bed Mobility Bed Mobility: Supine to Sit;Sitting - Scoot to Edge of Bed Supine to Sit: 4: Min assist;With rails;HOB flat Sitting - Scoot to [place] of Bed: 4: Min assist;With rail Details for Bed Mobility Assistance: min assist to support trunk for balance during transition to sitting EOB.   Heavy reliance on railing for support.   Transfers Transfers: Sit to Stand;Stand to Sit Sit to Stand: 4: Min assist;With upper extremity assist;With armrests;From bed Stand to Sit: 4: Min assist;With upper extremity assist;With armrests;To chair/3-in-1 Details for Transfer Assistance: min assist to support trunk for balance during transitions.  Pt with mild posterior lean upon first standing.  Pulled up on RW with bil hands to get to standing.   Ambulation/Gait Ambulation/Gait Assistance: 3: Mod assist Ambulation Distance (Feet): 95 Feet Assistive device: Rolling walker Ambulation/Gait Assistance Details: mod assist for one LOB during gait, otherwise min assist for most of gait with RW.  Pt using gaze stability compensations (finding a target to focus) without cues to do so (showing carryover of education completed).  Pt fatigued quickly and was followed with recliner chair to encourage increased gait distance and safety.   Gait Pattern: Step-through pattern;Decreased stride length (staggering to the left.  ) Gait velocity: less than 1.8 ft/sec indicating risk for recurrent falls Modified Rankin (Stroke Patients Only) Pre-Morbid Rankin Score: Slight disability Modified Rankin: Moderately severe disability      PT Goals (current goals can now be found in the care plan section) Acute Rehab PT Goals Patient Stated Goal: Back to normal  Visit Information  Last PT Received On: 04/29/13 Assistance Needed: +1 PT/OT Co-Evaluation/Treatment: Yes History of Present Illness: pt presents with hx of suddne onset dizziness lasting until pt arrived to ED.  MRI showed R Cerebellar Infarct and Vertebral Occlusion.      Subjective  Data  Subjective: Pt reports that her HA and dizziness are improving.  Daughter in room to help relay PLOF.   Patient Stated Goal: Back to normal   Cognition  Cognition Arousal/Alertness: Awake/alert Behavior During Therapy: WFL for tasks assessed/performed (pt very  quiet in nature, does not speak much unless spoken 2) Overall Cognitive Status: Impaired/Different from baseline Area of Impairment: Memory;Orientation Orientation Level: Time Memory: Decreased short-term memory General Comments: decreased recall of time wihout min verbal cues.  With cues she is able to correct.  When first asked the month she reported "January"    Balance  Balance Balance Assessed: Yes Static Sitting Balance Static Sitting - Balance Support: No upper extremity supported;Feet supported Static Sitting - Level of Assistance: 5: Stand by assistance Dynamic Sitting Balance Dynamic Sitting - Balance Support: Feet supported;No upper extremity supported;During functional activity (donning and doffing socks) Dynamic Sitting - Level of Assistance: 5: Stand by assistance Static Standing Balance Static Standing - Balance Support: Bilateral upper extremity supported Static Standing - Level of Assistance: 4: Min assist Dynamic Standing Balance Dynamic Standing - Balance Support: Bilateral upper extremity supported Dynamic Standing - Level of Assistance: 3: Mod assist Dynamic Standing - Comments: mod assit with RW  End of Session PT - End of Session Activity Tolerance: Patient limited by fatigue Patient left: in chair;with call bell/phone within reach;with family/visitor present (daughter in room) Nurse Communication: Mobility status     Lurena Joiner B. Cheri Ayotte, PT, DPT [phone]   04/29/2013, 2:46 PM

## 2013-04-29 NOTE — Progress Notes (Signed)
I met with pt and her daughter at bedside. 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 I have alerted SW and RN CM. [phone]

## 2013-05-03 ENCOUNTER — Non-Acute Institutional Stay (SKILLED_NURSING_FACILITY): Payer: Medicare Other | Admitting: Internal Medicine

## 2013-05-03 DIAGNOSIS — I509 Heart failure, unspecified: Secondary | ICD-10-CM

## 2013-05-03 DIAGNOSIS — E785 Hyperlipidemia, unspecified: Secondary | ICD-10-CM

## 2013-05-03 DIAGNOSIS — I6350 Cerebral infarction due to unspecified occlusion or stenosis of unspecified cerebral artery: Secondary | ICD-10-CM

## 2013-05-03 DIAGNOSIS — R42 Dizziness and giddiness: Secondary | ICD-10-CM

## 2013-05-03 DIAGNOSIS — I639 Cerebral infarction, unspecified: Secondary | ICD-10-CM | POA: Insufficient documentation

## 2013-05-03 DIAGNOSIS — K5900 Constipation, unspecified: Secondary | ICD-10-CM

## 2013-05-03 DIAGNOSIS — I10 Essential (primary) hypertension: Secondary | ICD-10-CM

## 2013-05-03 NOTE — Progress Notes (Signed)
Patient ID: Debbie Spears, female   DOB: 1911-12-11, 79 y.o.   MRN: [id number]  [facility]    PCP: Debbie Grout, MD  Code Status: DNR  Allergies  Allergen Reactions  . Olmesartan Medoxomil Diarrhea and Nausea And Vomiting    Chief Complaint: new admit to facility  HPI:  79 y/o female patient is here post hospital admission for dizziness. She has hx of cerebellar CVA, HTN, hyperlipidemia, CHF and afib. She was diagnosed to have acute CVA on MRI and it was thought that she had vertigo in setting of post stroke, likely autonomic. She has been sent to SNF for STR. Goal will be to return home. She is functional at her age and is seen in her room today. She moves around with a walker and has been working with therapy. Denies any complaints  Review of Systems  Constitutional: Negative for fever, chills and diaphoresis.  HENT: Negative for congestion.   Eyes: Negative for blurred vision.  Respiratory: Negative for cough, shortness of breath and stridor.   Cardiovascular: Negative for chest pain, orthopnea and leg swelling.  Gastrointestinal: Negative for heartburn, nausea, vomiting, diarrhea and constipation.  Genitourinary: Negative for dysuria.  Musculoskeletal: Negative for falls.  Skin: Negative for rash.  Neurological: Negative for dizziness, seizures, loss of consciousness, weakness and headaches.  Psychiatric/Behavioral: Negative for depression.     Past Medical History  Diagnosis Date  . Hypertension   . Hyperlipidemia   . Glucose intolerance (impaired glucose tolerance)   . Osteoporosis   . Osteoarthritis   . Atrial fibrillation   . Diastolic CHF, acute    Past Surgical History  Procedure Laterality Date  . Laparoscopic cholecystectomy  March 02, 2007   Social History:   reports that she has never smoked. She does not have any smokeless tobacco history on file. She reports that she does not drink alcohol or use illicit drugs.  Family History  Problem Relation  Age of Onset  . Stroke Mother   . Heart attack Brother     Medications: Patient's Medications  New Prescriptions   No medications on file  Previous Medications   ALBUTEROL (PROVENTIL) (5 MG/ML) 0.5% NEBULIZER SOLUTION    Take 0.5 mL (2.5 mg total) by nebulization every 2 (two) hours as needed for wheezing or shortness of breath.   AMLODIPINE (NORVASC) 5 MG TABLET    Take 1 tablet (5 mg total) by mouth daily.   ATORVASTATIN (LIPITOR) 20 MG TABLET    Take 1 tablet (20 mg total) by mouth daily at 6 PM.   BENAZEPRIL (LOTENSIN) 20 MG TABLET    Take 20 mg by mouth daily.   CALCIUM PO    Take 1 tablet by mouth daily with lunch.    CLOPIDOGREL (PLAVIX) 75 MG TABLET    Take 1 tablet (75 mg total) by mouth daily with breakfast.   DOCUSATE SODIUM (COLACE) 100 MG CAPSULE    Take 100-200 mg by mouth See admin instructions. Take 1 tablet every day at 5pm with dinner (take 2 tablets for severe constipation)   FUROSEMIDE (LASIX) 40 MG TABLET    Take 1 tablet (40 mg total) by mouth daily.   HYDRALAZINE (APRESOLINE) 25 MG TABLET    Take 25 mg by mouth 4 (four) times daily. 8am, 12pm, 5pm, 10pm   MULTIPLE VITAMIN (MULTIVITAMIN WITH MINERALS) TABS TABLET    Take 1 tablet by mouth daily with lunch. Viactiv chewables   POTASSIUM CHLORIDE SA (KLOR-CON M20) 20 MEQ TABLET  Take 1 tablet (20 mEq total) by mouth daily.  Modified Medications   No medications on file  Discontinued Medications   No medications on file     Filed Vitals:   05/03/13 1818  BP: 107/82  Pulse: 79  Temp: 98.1 F (36.7 C)  Resp: 18  SpO2: 96%   gen- elderly female in NAD HEENT- mmm, no jvd, no LAD, NCAT, perrla cvs- normal s1,s2, rrr respi- CTAB abdo- bs+, soft, non tender Ext- able to move all 4, good strength, using walker Neuro- no focal deficit Psych- mood and affect normal   Labs reviewed: Basic Metabolic Panel:  Recent Labs  40/98/11 1429 04/25/13 1942 04/26/13 0735  NA 140  --  143  K 4.3  --  4.2  CL 99   --  103  CO2 29  --  30  GLUCOSE 174*  --  88  BUN 28*  --  26*  CREATININE 1.40* 1.26* 1.45*  CALCIUM 9.7  --  9.2   Liver Function Tests:  Recent Labs  04/25/13 1429  AST 32  ALT 15  ALKPHOS 79  BILITOT 0.3  PROT 6.5  ALBUMIN 3.7   CBC:  Recent Labs  04/25/13 1429 04/25/13 1942 04/26/13 0735  WBC 7.9 5.1 5.6  NEUTROABS 7.3  --   --   HGB 14.1 13.9 12.9  HCT 40.8 41.2 40.0  MCV 95.8 96.0 97.6  PLT 142* 147* 141*   Cardiac Enzymes:  Recent Labs  04/25/13 1929 04/26/13 0123 04/26/13 0735  TROPONINI <0.30 <0.30 <0.30   Radiological exam  Ct Head Wo Contrast  04/25/2013   *RADIOLOGY REPORT*  Clinical Data: Dizziness.  Nausea and vomiting  CT HEAD WITHOUT CONTRAST  Technique:  Contiguous axial images were obtained from the base of the skull through the vertex without contrast.  Comparison: None.  Findings: Generalized atrophy.  Chronic right parietal cortical infarct.  Mild chronic microvascular ischemic change.  Small chronic infarct left cerebellum.  Negative for acute infarct, hemorrhage, or mass lesion.  Negative for skull lesion.  IMPRESSION: Chronic right parietal infarct.  Small chronic infarct left cerebellum.  No acute abnormality.   Original Report Authenticated By: Janeece Riggers, M.D.   Mr Brain Wo Contrast  04/25/2013   *RADIOLOGY REPORT*  Clinical Data: Evaluate for CVA.  MRI HEAD WITHOUT CONTRAST  Technique:  Multiplanar, multiecho pulse sequences of the brain and surrounding structures were obtained according to standard protocol without intravenous contrast.  Comparison: Head CT from the same day.  Findings:  Calvarium and upper cervical spine: No marrow signal abnormality.  Orbits: Bilateral cataract resection.  Sinuses: Clear. Mastoid and middle ears are clear.  Brain: Restricted diffusion within the inferior, medial right cerebellum, posterior inferior cerebellar artery territory, with some sparing of the lateral cerebellum.  Remote infarcts in the  peripheral left cerebellum and right posterior border region (affecting the parietal lobes predominately).  Abnormal vertebral arteries: Loss of normal flow related signal loss within the right V3 segment, with poor or no flow in the right V4 segment. The left vertebral artery remains patent.  Global atrophy.  Negative for hydrocephalus, shift, or evidence of mass.  Critical Value/emergent results were called by telephone at the time of interpretation on 04/25/2013 at 23:30 to [facility], who verbally acknowledged these results.  IMPRESSION: 1.  Acute right posterior inferior cerebellar artery territory infarction.  Distal right vertebral artery occlusion (more likely) or slow flow. 2.  Remote ischemic injuries to the right parietal lobe  and left cerebellum.   Original Report Authenticated By: Tiburcio Pea   Dg Chest Port 1 View  04/29/2013   *RADIOLOGY REPORT*  Clinical Data: Shortness of breath.  PORTABLE CHEST - 1 VIEW  Comparison: 05/09/2010.  Findings: Cardiomegaly.  Right-sided pleural effusion.  Opacification right base may represent pleural fluid with atelectasis although limited for evaluating for possibility of underlying infiltrate or mass.  Pulmonary vascular prominence most notable centrally.  Calcified tortuous aorta.  No gross pneumothorax.  IMPRESSION: Cardiomegaly.  Right-sided pleural effusion.  Opacification right base may represent pleural fluid with atelectasis although limited for evaluating for possibility of underlying infiltrate or mass.  Pulmonary vascular prominence most notable centrally.  Calcified tortuous aorta.   Original Report Authenticated By: Lacy Duverney, M.D.    Assessment/Plan  Acute CVA- continue plavix and statin. Continue working with therapy team. Fall precautions  Dizziness- like autonomic in setting of recent CVA. Under control at present. Monitor  HTN- bp under control. Continue amlodipine, benazepril and hydralazine  chf- stable at present, continue  benazepril with hydralazine and lasix, monitor bmp. Continue kcl supplement  Hypokalemia- continue kcl supplement  Constipation- well controlled. Continue colace   Family/ staff Communication: reviewed care plan with patient and charge nurse   Goals of care: to return home   Labs/tests ordered- cbc, cmp

## 2013-05-08 ENCOUNTER — Ambulatory Visit (INDEPENDENT_AMBULATORY_CARE_PROVIDER_SITE_OTHER): Payer: Medicare Other | Admitting: Ophthalmology

## 2013-05-21 DIAGNOSIS — I69959 Hemiplegia and hemiparesis following unspecified cerebrovascular disease affecting unspecified side: Secondary | ICD-10-CM

## 2013-05-21 DIAGNOSIS — I10 Essential (primary) hypertension: Secondary | ICD-10-CM

## 2013-05-21 DIAGNOSIS — R269 Unspecified abnormalities of gait and mobility: Secondary | ICD-10-CM

## 2013-05-21 DIAGNOSIS — I5033 Acute on chronic diastolic (congestive) heart failure: Secondary | ICD-10-CM

## 2013-06-03 ENCOUNTER — Ambulatory Visit (INDEPENDENT_AMBULATORY_CARE_PROVIDER_SITE_OTHER): Payer: Medicare Other | Admitting: Ophthalmology

## 2013-06-03 DIAGNOSIS — H35039 Hypertensive retinopathy, unspecified eye: Secondary | ICD-10-CM

## 2013-06-03 DIAGNOSIS — H2510 Age-related nuclear cataract, unspecified eye: Secondary | ICD-10-CM

## 2013-06-03 DIAGNOSIS — H33309 Unspecified retinal break, unspecified eye: Secondary | ICD-10-CM

## 2013-06-03 DIAGNOSIS — I10 Essential (primary) hypertension: Secondary | ICD-10-CM

## 2013-06-03 DIAGNOSIS — H43819 Vitreous degeneration, unspecified eye: Secondary | ICD-10-CM

## 2013-06-03 DIAGNOSIS — D3130 Benign neoplasm of unspecified choroid: Secondary | ICD-10-CM

## 2013-06-05 ENCOUNTER — Encounter: Payer: Self-pay | Admitting: *Deleted

## 2013-06-10 ENCOUNTER — Encounter: Payer: Self-pay | Admitting: Internal Medicine

## 2013-06-10 ENCOUNTER — Ambulatory Visit (INDEPENDENT_AMBULATORY_CARE_PROVIDER_SITE_OTHER): Payer: Medicare Other | Admitting: Internal Medicine

## 2013-06-10 VITALS — BP 140/60 | HR 65 | Ht 62.0 in | Wt 103.0 lb

## 2013-06-10 DIAGNOSIS — I10 Essential (primary) hypertension: Secondary | ICD-10-CM

## 2013-06-10 DIAGNOSIS — I4891 Unspecified atrial fibrillation: Secondary | ICD-10-CM

## 2013-06-10 DIAGNOSIS — I639 Cerebral infarction, unspecified: Secondary | ICD-10-CM

## 2013-06-10 DIAGNOSIS — I6350 Cerebral infarction due to unspecified occlusion or stenosis of unspecified cerebral artery: Secondary | ICD-10-CM

## 2013-06-10 MED ORDER — LISINOPRIL 20 MG PO TABS: 20.0000 mg | ORAL_TABLET | Freq: Every day | ORAL | Status: DC

## 2013-06-10 MED ORDER — APIXABAN 2.5 MG PO TABS: 2.5000 mg | ORAL_TABLET | Freq: Two times a day (BID) | ORAL | Status: DC

## 2013-06-10 MED ORDER — ATORVASTATIN CALCIUM 20 MG PO TABS: 20.0000 mg | ORAL_TABLET | Freq: Every day | ORAL | Status: DC

## 2013-06-10 NOTE — Patient Instructions (Addendum)
Your physician has recommended you make the following change in your medication: stop taking Plavix and start taking Eliquis 2.5 mg twice daily  Your physician wants you to follow-up in: February. You will receive a reminder letter in the mail two months in advance. If you don't receive a letter, please call our office to schedule the follow-up appointment.

## 2013-06-10 NOTE — Progress Notes (Signed)
HPI Patient is a 79 year old with a history of atrial fibrillation, diastolic CHF, HTN, HL.  I saw her in clinic 1 year ago.   She was recently admitted to [facility] with dizziness  Found to have a cerebellar CVA  Not TPA candidate.  Since has recovered No falls   No CP  Breathing is OK  No palpitations  Allergies  Allergen Reactions  . Olmesartan Medoxomil Diarrhea and Nausea And Vomiting    Current Outpatient Prescriptions  Medication Sig Dispense Refill  . amLODipine (NORVASC) 5 MG tablet Take 1 tablet (5 mg total) by mouth daily.  90 tablet  0  . atorvastatin (LIPITOR) 20 MG tablet Take 1 tablet (20 mg total) by mouth daily at 6 PM.      . benazepril (LOTENSIN) 20 MG tablet Take 20 mg by mouth daily.      Marland Kitchen CALCIUM PO Take 1 tablet by mouth daily with lunch.       . clopidogrel (PLAVIX) 75 MG tablet Take 1 tablet (75 mg total) by mouth daily with breakfast.      . furosemide (LASIX) 40 MG tablet Take 1 tablet (40 mg total) by mouth daily.  30 tablet  2  . hydrALAZINE (APRESOLINE) 25 MG tablet Take 25 mg by mouth 4 (four) times daily. 8am, 12pm, 5pm, 10pm      . lisinopril (PRINIVIL,ZESTRIL) 20 MG tablet Take 20 mg by mouth daily.       . Multiple Vitamin (MULTIVITAMIN WITH MINERALS) TABS tablet Take 1 tablet by mouth daily with lunch. Viactiv chewables      . potassium chloride SA (KLOR-CON M20) 20 MEQ tablet Take 1 tablet (20 mEq total) by mouth daily.  30 tablet  2  . Sennosides-Docusate Sodium (SENNA PLUS PO) Take by mouth as needed.      Wallene Dales ophthalmic ointment        No current facility-administered medications for this visit.    Past Medical History  Diagnosis Date  . Hypertension   . Hyperlipidemia   . Glucose intolerance (impaired glucose tolerance)   . Osteoporosis   . Osteoarthritis   . Atrial fibrillation   . Diastolic CHF, acute     Past Surgical History  Procedure Laterality Date  . Laparoscopic cholecystectomy  March 02, 2007    Family History   Problem Relation Age of Onset  . Stroke Mother   . Heart attack Brother     History   Social History  . Marital Status: Widowed    Spouse Name: N/A    Number of Children: N/A  . Years of Education: N/A   Occupational History  . Not on file.   Social History Main Topics  . Smoking status: Never Smoker   . Smokeless tobacco: Not on file  . Alcohol Use: No  . Drug Use: No  . Sexual Activity: No   Other Topics Concern  . Not on file   Social History Narrative   Widow and lives alone. Daughter Aurea Graff and others call or check on her daily.     Review of Systems:  All systems reviewed.  They are negative to the above problem except as previously stated.  Vital Signs: BP 140/60  Pulse 65  Ht 5\' 2"  (1.575 m)  Wt 103 lb (46.72 kg)  BMI 18.83 kg/m2  Physical Exam Patient is in NAD HEENT:  Normocephalic, atraumatic. EOMI, PERRLA.  Neck: JVP is normal. No thyromegaly. No bruits.  Lungs: clear to  auscultation. No rales no wheezes.  Heart: Regular rate and rhythm. Normal S1, S2. No S3.   No significant murmurs. PMI not displaced.  Abdomen:  Supple, nontender. Normal bowel sounds. No masses. No hepatomegaly.  Extremities:   Good distal pulses throughout. No lower extremity edema.  Musculoskeletal :moving all extremities.  Neuro:   alert and oriented x3.  CN II-XII grossly intact.  EKG:  Atrial fibrillaton.  65 bpm.  LVH.  Nonspecific ST T wave changes. Assessment and Plan:  1.  Atrial fibrillation  Patient remains in afib  On Plavix alone  Last fall  In Jan  Prior to that was about 1 1/2 years rpior  Did not hit head at either fall  .   Recovered from CVA  Not dizzy   Would stop Plavix  Start Eliquis 2.5 bid.   Patient and family counselled   F/U in Feb  2.  Diastolic CHF. Volume status looks OK   3.  HTN.  Adequate

## 2013-06-11 ENCOUNTER — Other Ambulatory Visit: Payer: Self-pay | Admitting: Internal Medicine

## 2013-07-09 ENCOUNTER — Telehealth: Payer: Self-pay | Admitting: *Deleted

## 2013-07-09 NOTE — Telephone Encounter (Signed)
Per [employer] rx eliquis approved for 1 year

## 2013-07-21 ENCOUNTER — Other Ambulatory Visit: Payer: Self-pay | Admitting: Internal Medicine

## 2013-09-02 ENCOUNTER — Other Ambulatory Visit: Payer: Self-pay | Admitting: Internal Medicine

## 2013-10-07 ENCOUNTER — Other Ambulatory Visit: Payer: Self-pay | Admitting: Internal Medicine

## 2013-11-03 ENCOUNTER — Other Ambulatory Visit: Payer: Self-pay | Admitting: Internal Medicine

## 2013-11-07 ENCOUNTER — Ambulatory Visit: Payer: Medicare Other | Admitting: Internal Medicine

## 2013-11-11 ENCOUNTER — Ambulatory Visit: Payer: Medicare Other | Admitting: Internal Medicine

## 2013-11-20 ENCOUNTER — Other Ambulatory Visit: Payer: Self-pay | Admitting: Internal Medicine

## 2013-12-02 ENCOUNTER — Other Ambulatory Visit: Payer: Self-pay | Admitting: Internal Medicine

## 2013-12-24 ENCOUNTER — Other Ambulatory Visit: Payer: Self-pay | Admitting: Internal Medicine

## 2014-01-01 ENCOUNTER — Other Ambulatory Visit: Payer: Self-pay | Admitting: Internal Medicine

## 2014-01-03 ENCOUNTER — Ambulatory Visit (INDEPENDENT_AMBULATORY_CARE_PROVIDER_SITE_OTHER): Payer: Medicare Other | Admitting: Internal Medicine

## 2014-01-03 ENCOUNTER — Encounter: Payer: Self-pay | Admitting: Internal Medicine

## 2014-01-03 VITALS — BP 162/60 | HR 65 | Wt 102.0 lb

## 2014-01-03 DIAGNOSIS — I4891 Unspecified atrial fibrillation: Secondary | ICD-10-CM

## 2014-01-03 NOTE — Patient Instructions (Signed)
Your physician wants you to follow-up in: [place] will receive a reminder letter in the mail two months in advance. If you don't receive a letter, please call our office to schedule the follow-up appointment.

## 2014-01-03 NOTE — Progress Notes (Signed)
HPI Patient is a 80 year old with a history of atrial fibrillation, diastolic CHF, HTN, HL and cerebellar CVA  Since she ws in clinic she has done well No CP  No SOB  No dizziness  No falls.  Allergies  Allergen Reactions  . Olmesartan Medoxomil Diarrhea and Nausea And Vomiting    Current Outpatient Prescriptions  Medication Sig Dispense Refill  . amLODipine (NORVASC) 5 MG tablet TAKE 1 TABLET BY MOUTH EVERY DAY  30 tablet  0  . benazepril (LOTENSIN) 20 MG tablet Take 20 mg by mouth daily.      Marland Kitchen CALCIUM PO Take 1 tablet by mouth daily with lunch.       Marland Kitchen ELIQUIS 2.5 MG TABS tablet TAKE 1 TABLET BY MOUTH TWICE A DAY  60 tablet  4  . furosemide (LASIX) 40 MG tablet Take 1 tablet (40 mg total) by mouth daily.  30 tablet  2  . hydrALAZINE (APRESOLINE) 25 MG tablet TAKE 1 TABLET BY MOUTH 4 TIMES A DAY  120 tablet  0  . KLOR-CON M20 20 MEQ tablet TAKE 1 TABLET BY MOUTH EVERY DAY  30 tablet  4  . Multiple Vitamin (MULTIVITAMIN WITH MINERALS) TABS tablet Take 1 tablet by mouth daily with lunch. Viactiv chewables      . Sennosides-Docusate Sodium (SENNA PLUS PO) Take by mouth as needed.       No current facility-administered medications for this visit.    Past Medical History  Diagnosis Date  . Hypertension   . Hyperlipidemia   . Glucose intolerance (impaired glucose tolerance)   . Osteoporosis   . Osteoarthritis   . Atrial fibrillation   . Diastolic CHF, acute     Past Surgical History  Procedure Laterality Date  . Laparoscopic cholecystectomy  March 02, 2007    Family History  Problem Relation Age of Onset  . Stroke Mother   . Heart attack Brother     History   Social History  . Marital Status: Widowed    Spouse Name: N/A    Number of Children: N/A  . Years of Education: N/A   Occupational History  . Not on file.   Social History Main Topics  . Smoking status: Never Smoker   . Smokeless tobacco: Not on file  . Alcohol Use: No  . Drug Use: No  . Sexual Activity:  No   Other Topics Concern  . Not on file   Social History Narrative   Widow and lives alone. Daughter Remo Lipps and others call or check on her daily.     Review of Systems:  All systems reviewed.  They are negative to the above problem except as previously stated.  Vital Signs: BP 160/60  P 65  Wt 102  Physical Exam Patient is in NAD HEENT:  Normocephalic, atraumatic. EOMI, PERRLA.  Neck: JVP is normal. No thyromegaly. No bruits.  Lungs: clear to auscultation. No rales no wheezes.  Heart: Regular rate and rhythm. Normal S1, S2. No S3.   No significant murmurs. PMI not displaced.  Abdomen:  Supple, nontender. Normal bowel sounds. No masses. No hepatomegaly.  Extremities:   Good distal pulses throughout. No lower extremity edema.  Musculoskeletal :moving all extremities.  Neuro:   alert and oriented x3.  CN II-XII grossly intact.  EKG:  Atrial fibrillaton.  65 bpm.  LVH.  Septal MI Assessment and Plan:  1.  Atrial fibrillation  Remains symptom free.  Continue eliquis  Follow labs   2.  Diastolic CHF. Volume status looks OK   3.  HTN.  Adequate

## 2014-01-14 ENCOUNTER — Other Ambulatory Visit: Payer: Self-pay | Admitting: Internal Medicine

## 2014-01-23 ENCOUNTER — Other Ambulatory Visit: Payer: Self-pay | Admitting: Internal Medicine

## 2014-02-03 ENCOUNTER — Other Ambulatory Visit: Payer: Self-pay | Admitting: Internal Medicine

## 2014-03-04 ENCOUNTER — Other Ambulatory Visit: Payer: Self-pay | Admitting: Internal Medicine

## 2014-03-31 ENCOUNTER — Other Ambulatory Visit: Payer: Self-pay | Admitting: Internal Medicine

## 2014-06-09 ENCOUNTER — Ambulatory Visit (INDEPENDENT_AMBULATORY_CARE_PROVIDER_SITE_OTHER): Payer: Medicare Other | Admitting: Ophthalmology

## 2014-06-19 ENCOUNTER — Ambulatory Visit (INDEPENDENT_AMBULATORY_CARE_PROVIDER_SITE_OTHER): Payer: Medicare Other | Admitting: Ophthalmology

## 2014-06-19 DIAGNOSIS — H35033 Hypertensive retinopathy, bilateral: Secondary | ICD-10-CM

## 2014-06-19 DIAGNOSIS — H33302 Unspecified retinal break, left eye: Secondary | ICD-10-CM

## 2014-06-19 DIAGNOSIS — I10 Essential (primary) hypertension: Secondary | ICD-10-CM

## 2014-06-19 DIAGNOSIS — H43813 Vitreous degeneration, bilateral: Secondary | ICD-10-CM

## 2014-06-19 DIAGNOSIS — D3131 Benign neoplasm of right choroid: Secondary | ICD-10-CM

## 2014-07-16 ENCOUNTER — Other Ambulatory Visit: Payer: Self-pay | Admitting: Internal Medicine

## 2014-08-05 ENCOUNTER — Telehealth: Payer: Self-pay | Admitting: Internal Medicine

## 2014-08-05 ENCOUNTER — Other Ambulatory Visit: Payer: Self-pay | Admitting: Internal Medicine

## 2014-08-05 NOTE — Telephone Encounter (Signed)
New Message        Pt's daughter calling wanting to get pt's rx changed from being 30 supply to 90 day supply. Please call back and advise.

## 2014-08-08 ENCOUNTER — Other Ambulatory Visit: Payer: Self-pay | Admitting: *Deleted

## 2014-08-08 MED ORDER — AMLODIPINE BESYLATE 5 MG PO TABS: 5.0000 mg | ORAL_TABLET | Freq: Every day | ORAL | Status: AC

## 2014-08-08 MED ORDER — POTASSIUM CHLORIDE CRYS ER 20 MEQ PO TBCR: 20.0000 meq | EXTENDED_RELEASE_TABLET | Freq: Every day | ORAL | Status: AC

## 2014-08-08 MED ORDER — HYDRALAZINE HCL 25 MG PO TABS: 25.0000 mg | ORAL_TABLET | Freq: Four times a day (QID) | ORAL | Status: AC

## 2014-08-08 MED ORDER — FUROSEMIDE 40 MG PO TABS: 40.0000 mg | ORAL_TABLET | Freq: Every day | ORAL | Status: AC

## 2014-08-08 MED ORDER — APIXABAN 2.5 MG PO TABS: 2.5000 mg | ORAL_TABLET | Freq: Two times a day (BID) | ORAL | Status: AC

## 2014-08-08 MED ORDER — BENAZEPRIL HCL 20 MG PO TABS: 20.0000 mg | ORAL_TABLET | Freq: Every day | ORAL | Status: AC

## 2014-08-08 NOTE — Telephone Encounter (Signed)
New Msg   Patient daughter calling about refill that is needed. Meds were on 90 day supply but are now 30days. Patient wants all prescriptions authorized for 90 days.  Patient is out of medication as of today and needs an authorization for a refill. Patient uses [facility] on [address], [place]. Please contact daughter Remo Lipps at [phone].

## 2014-08-12 NOTE — Telephone Encounter (Signed)
Medications were ordered as 90 day supply on 08/08/14.

## 2014-09-17 ENCOUNTER — Emergency Department (HOSPITAL_COMMUNITY): Payer: Medicare Other

## 2014-09-17 ENCOUNTER — Inpatient Hospital Stay (HOSPITAL_COMMUNITY)
Admission: EM | Admit: 2014-09-17 | Discharge: 2014-09-20 | DRG: 536 | Disposition: A | Discharge status: Skilled Nursing Facility | Payer: Medicare Other | Attending: Oncology | Admitting: Oncology

## 2014-09-17 ENCOUNTER — Encounter (HOSPITAL_COMMUNITY): Payer: Self-pay | Admitting: Nurse Practitioner

## 2014-09-17 DIAGNOSIS — M810 Age-related osteoporosis without current pathological fracture: Secondary | ICD-10-CM | POA: Diagnosis present

## 2014-09-17 DIAGNOSIS — S32591A Other specified fracture of right pubis, initial encounter for closed fracture: Principal | ICD-10-CM | POA: Diagnosis present

## 2014-09-17 DIAGNOSIS — Z9841 Cataract extraction status, right eye: Secondary | ICD-10-CM

## 2014-09-17 DIAGNOSIS — I5032 Chronic diastolic (congestive) heart failure: Secondary | ICD-10-CM | POA: Diagnosis present

## 2014-09-17 DIAGNOSIS — Z888 Allergy status to other drugs, medicaments and biological substances status: Secondary | ICD-10-CM | POA: Diagnosis not present

## 2014-09-17 DIAGNOSIS — I509 Heart failure, unspecified: Secondary | ICD-10-CM

## 2014-09-17 DIAGNOSIS — M1990 Unspecified osteoarthritis, unspecified site: Secondary | ICD-10-CM | POA: Diagnosis present

## 2014-09-17 DIAGNOSIS — Z8673 Personal history of transient ischemic attack (TIA), and cerebral infarction without residual deficits: Secondary | ICD-10-CM | POA: Diagnosis not present

## 2014-09-17 DIAGNOSIS — Z961 Presence of intraocular lens: Secondary | ICD-10-CM | POA: Diagnosis present

## 2014-09-17 DIAGNOSIS — Z9181 History of falling: Secondary | ICD-10-CM

## 2014-09-17 DIAGNOSIS — W1789XA Other fall from one level to another, initial encounter: Secondary | ICD-10-CM | POA: Diagnosis present

## 2014-09-17 DIAGNOSIS — D72829 Elevated white blood cell count, unspecified: Secondary | ICD-10-CM | POA: Diagnosis present

## 2014-09-17 DIAGNOSIS — Z9842 Cataract extraction status, left eye: Secondary | ICD-10-CM | POA: Diagnosis not present

## 2014-09-17 DIAGNOSIS — I4891 Unspecified atrial fibrillation: Secondary | ICD-10-CM | POA: Diagnosis present

## 2014-09-17 DIAGNOSIS — J449 Chronic obstructive pulmonary disease, unspecified: Secondary | ICD-10-CM | POA: Diagnosis present

## 2014-09-17 DIAGNOSIS — I10 Essential (primary) hypertension: Secondary | ICD-10-CM | POA: Diagnosis present

## 2014-09-17 DIAGNOSIS — E785 Hyperlipidemia, unspecified: Secondary | ICD-10-CM | POA: Diagnosis present

## 2014-09-17 DIAGNOSIS — S32599A Other specified fracture of unspecified pubis, initial encounter for closed fracture: Secondary | ICD-10-CM | POA: Diagnosis present

## 2014-09-17 DIAGNOSIS — Y92019 Unspecified place in single-family (private) house as the place of occurrence of the external cause: Secondary | ICD-10-CM

## 2014-09-17 DIAGNOSIS — S329XXA Fracture of unspecified parts of lumbosacral spine and pelvis, initial encounter for closed fracture: Secondary | ICD-10-CM | POA: Diagnosis present

## 2014-09-17 DIAGNOSIS — M25559 Pain in unspecified hip: Secondary | ICD-10-CM | POA: Diagnosis not present

## 2014-09-17 DIAGNOSIS — W19XXXA Unspecified fall, initial encounter: Secondary | ICD-10-CM | POA: Insufficient documentation

## 2014-09-17 HISTORY — DX: Fracture of unspecified parts of lumbosacral spine and pelvis, initial encounter for closed fracture: S32.9XXA

## 2014-09-17 HISTORY — DX: Cerebral infarction, unspecified: I63.9

## 2014-09-17 LAB — URINALYSIS, ROUTINE W REFLEX MICROSCOPIC
Bilirubin Urine: NEGATIVE
Glucose, UA: NEGATIVE mg/dL
Hgb urine dipstick: NEGATIVE
KETONES UR: NEGATIVE mg/dL
LEUKOCYTES UA: NEGATIVE
Nitrite: NEGATIVE
PROTEIN: NEGATIVE mg/dL
Specific Gravity, Urine: 1.009 (ref 1.005–1.030)
UROBILINOGEN UA: 0.2 mg/dL (ref 0.0–1.0)
pH: 7 (ref 5.0–8.0)

## 2014-09-17 LAB — CBC WITH DIFFERENTIAL/PLATELET
BASOS ABS: 0 10*3/uL (ref 0.0–0.1)
Basophils Relative: 0 % (ref 0–1)
EOS ABS: 0 10*3/uL (ref 0.0–0.7)
EOS PCT: 0 % (ref 0–5)
HCT: 42.2 % (ref 36.0–46.0)
Hemoglobin: 13.7 g/dL (ref 12.0–15.0)
LYMPHS PCT: 7 % — AB (ref 12–46)
Lymphs Abs: 0.9 10*3/uL (ref 0.7–4.0)
MCH: 31.4 pg (ref 26.0–34.0)
MCHC: 32.5 g/dL (ref 30.0–36.0)
MCV: 96.8 fL (ref 78.0–100.0)
Monocytes Absolute: 0.9 10*3/uL (ref 0.1–1.0)
Monocytes Relative: 7 % (ref 3–12)
Neutro Abs: 11 10*3/uL — ABNORMAL HIGH (ref 1.7–7.7)
Neutrophils Relative %: 86 % — ABNORMAL HIGH (ref 43–77)
Platelets: 153 10*3/uL (ref 150–400)
RBC: 4.36 MIL/uL (ref 3.87–5.11)
RDW: 14.3 % (ref 11.5–15.5)
WBC: 12.7 10*3/uL — ABNORMAL HIGH (ref 4.0–10.5)

## 2014-09-17 LAB — BASIC METABOLIC PANEL
ANION GAP: 13 (ref 5–15)
BUN: 26 mg/dL — AB (ref 6–23)
CALCIUM: 9.3 mg/dL (ref 8.4–10.5)
CO2: 30 mmol/L (ref 19–32)
Chloride: 102 mEq/L (ref 96–112)
Creatinine, Ser: 1.54 mg/dL — ABNORMAL HIGH (ref 0.50–1.10)
GFR, EST AFRICAN AMERICAN: 31 mL/min — AB (ref >90)
GFR, EST NON AFRICAN AMERICAN: 27 mL/min — AB (ref >90)
Glucose, Bld: 119 mg/dL — ABNORMAL HIGH (ref 70–99)
Potassium: 4 mmol/L (ref 3.5–5.1)
SODIUM: 145 mmol/L (ref 135–145)

## 2014-09-17 MED ORDER — ACETAMINOPHEN 650 MG RE SUPP: 650.0000 mg | Freq: Four times a day (QID) | RECTAL | Status: DC | PRN

## 2014-09-17 MED ORDER — HYDROCODONE-ACETAMINOPHEN 5-325 MG PO TABS
0.5000 | ORAL_TABLET | Freq: Once | ORAL | Status: AC
  Administered 2014-09-17: 0.5 via ORAL
  Filled 2014-09-17: qty 1

## 2014-09-17 MED ORDER — APIXABAN 2.5 MG PO TABS
2.5000 mg | ORAL_TABLET | Freq: Two times a day (BID) | ORAL | Status: DC
  Administered 2014-09-17 – 2014-09-20 (×6): 2.5 mg via ORAL
  Filled 2014-09-17 (×7): qty 1

## 2014-09-17 MED ORDER — FUROSEMIDE 40 MG PO TABS
40.0000 mg | ORAL_TABLET | Freq: Every day | ORAL | Status: DC
  Administered 2014-09-18 – 2014-09-20 (×3): 40 mg via ORAL
  Filled 2014-09-17 (×3): qty 1

## 2014-09-17 MED ORDER — DIPHENHYDRAMINE HCL 12.5 MG/5ML PO ELIX
12.5000 mg | ORAL_SOLUTION | Freq: Once | ORAL | Status: DC
  Filled 2014-09-17: qty 5

## 2014-09-17 MED ORDER — HYDRALAZINE HCL 25 MG PO TABS
25.0000 mg | ORAL_TABLET | Freq: Four times a day (QID) | ORAL | Status: DC
  Administered 2014-09-17 – 2014-09-20 (×8): 25 mg via ORAL
  Filled 2014-09-17 (×13): qty 1

## 2014-09-17 MED ORDER — OXYCODONE HCL 5 MG PO TABS: 5.0000 mg | ORAL_TABLET | ORAL | Status: DC | PRN

## 2014-09-17 MED ORDER — SENNOSIDES-DOCUSATE SODIUM 8.6-50 MG PO TABS: 1.0000 | ORAL_TABLET | ORAL | Status: DC | PRN

## 2014-09-17 MED ORDER — AMLODIPINE BESYLATE 5 MG PO TABS
5.0000 mg | ORAL_TABLET | Freq: Every day | ORAL | Status: DC
  Administered 2014-09-18 – 2014-09-20 (×3): 5 mg via ORAL
  Filled 2014-09-17 (×3): qty 1

## 2014-09-17 MED ORDER — OXYCODONE HCL 5 MG PO TABS
2.5000 mg | ORAL_TABLET | ORAL | Status: DC | PRN
Start: 2014-09-17 — End: 2014-09-20
  Administered 2014-09-17 – 2014-09-20 (×7): 2.5 mg via ORAL
  Filled 2014-09-17 (×7): qty 1

## 2014-09-17 MED ORDER — ACETAMINOPHEN 325 MG PO TABS
650.0000 mg | ORAL_TABLET | Freq: Four times a day (QID) | ORAL | Status: DC | PRN
  Administered 2014-09-17 – 2014-09-19 (×2): 650 mg via ORAL
  Filled 2014-09-17 (×2): qty 2

## 2014-09-17 MED ORDER — BENAZEPRIL HCL 20 MG PO TABS
20.0000 mg | ORAL_TABLET | Freq: Every day | ORAL | Status: DC
  Administered 2014-09-18 – 2014-09-20 (×3): 20 mg via ORAL
  Filled 2014-09-17 (×3): qty 1

## 2014-09-17 MED ORDER — POTASSIUM CHLORIDE CRYS ER 20 MEQ PO TBCR
20.0000 meq | EXTENDED_RELEASE_TABLET | Freq: Every day | ORAL | Status: DC
  Administered 2014-09-18 – 2014-09-20 (×3): 20 meq via ORAL
  Filled 2014-09-17 (×3): qty 1

## 2014-09-17 NOTE — Consult Note (Signed)
ORTHOPAEDIC CONSULTATION  REQUESTING PHYSICIAN: Annia Belt, MD  Chief Complaint: pelvic fx  HPI: Debbie Spears is a 81 y.o. female who complains of pelvic fx s/p mechanical fall.  Denies loc, neck pain, abd pain.  Does have superficial abrasion of right elbow.  Ortho consulted for rami fx.  Past Medical History  Diagnosis Date  . Hypertension   . Hyperlipidemia   . Glucose intolerance (impaired glucose tolerance)   . Osteoporosis   . Osteoarthritis   . Atrial fibrillation   . Diastolic CHF, acute    Past Surgical History  Procedure Laterality Date  . Laparoscopic cholecystectomy  March 02, 2007   History   Social History  . Marital Status: Widowed    Spouse Name: N/A    Number of Children: N/A  . Years of Education: N/A   Social History Main Topics  . Smoking status: Never Smoker   . Smokeless tobacco: None  . Alcohol Use: No  . Drug Use: No  . Sexual Activity: No   Other Topics Concern  . None   Social History Narrative   Widow and lives alone. Daughter Remo Lipps and others call or check on her daily.    Family History  Problem Relation Age of Onset  . Stroke Mother   . Heart attack Brother    Allergies  Allergen Reactions  . Olmesartan Medoxomil Diarrhea and Nausea And Vomiting   Prior to Admission medications   Medication Sig Start Date End Date Taking? Authorizing Provider  amLODipine (NORVASC) 5 MG tablet Take 1 tablet (5 mg total) by mouth daily. 08/08/14  Yes Fay Records, MD  apixaban (ELIQUIS) 2.5 MG TABS tablet Take 1 tablet (2.5 mg total) by mouth 2 (two) times daily. 08/08/14  Yes Fay Records, MD  benazepril (LOTENSIN) 20 MG tablet Take 1 tablet (20 mg total) by mouth daily. 08/08/14  Yes Fay Records, MD  furosemide (LASIX) 40 MG tablet Take 1 tablet (40 mg total) by mouth daily. 08/08/14  Yes Fay Records, MD  hydrALAZINE (APRESOLINE) 25 MG tablet Take 1 tablet (25 mg total) by mouth 4 (four) times daily. 08/08/14  Yes Fay Records,  MD  potassium chloride SA (KLOR-CON M20) 20 MEQ tablet Take 1 tablet (20 mEq total) by mouth daily. 08/08/14  Yes Fay Records, MD  Sennosides-Docusate Sodium (SENNA PLUS PO) Take by mouth as needed.   Yes Historical Provider, MD   Dg Chest 1 View  09/17/2014   CLINICAL DATA:  Status post fall today with tenderness in the upper and lower back  EXAM: CHEST - 1 VIEW  COMPARISON:  Portable chest x-ray of April 29, 2013.  FINDINGS: The lungs are mildly hyperinflated. There is no focal infiltrate. There is no pleural effusion or pneumothorax. The cardiac silhouette is enlarged but stable. The pulmonary vascularity is normal. There is calcification in the wall of the thoracic aorta. The bony structures are osteopenic. No acute rib fracture is demonstrated.  IMPRESSION: COPD and mild stable cardiac enlargement. There is no active cardiopulmonary disease.   Electronically Signed   By: David  Martinique   On: 09/17/2014 11:13   Dg Thoracic Spine 2 View  09/17/2014   CLINICAL DATA:  Acute lower back pain after fall today.  EXAM: THORACIC SPINE - 2 VIEW  COMPARISON:  None.  FINDINGS: Diffuse osteopenia is noted. No fracture or spondylolisthesis is noted. Ossification of anterior longitudinal ligament is noted suggesting diffuse idiopathic skeletal hyperostosis. Degenerative disc  disease at T12-L1 is noted with anterior osteophyte formation  IMPRESSION: No acute abnormality seen in the thoracic spine.   Electronically Signed   By: Sabino Dick M.D.   On: 09/17/2014 11:20   Dg Lumbar Spine Complete  09/17/2014   CLINICAL DATA:  Injury today.  Pain.  Initial evaluation.  EXAM: LUMBAR SPINE - COMPLETE 4+ VIEW  COMPARISON:  02/22/2007.  FINDINGS: Diffuse osteopenia and degenerative change noted. Degenerative changes are present at multiple levels. No acute bony abnormality. No evidence of fracture. Normal alignment. Surgical clips right upper quadrant.  IMPRESSION: Diffuse severe osteopenia and degenerative change. No acute  abnormality.   Electronically Signed   By: Marcello Moores  Register   On: 09/17/2014 11:18   Ct Head Wo Contrast  09/17/2014   CLINICAL DATA:  Fall today, trauma to back of head. Right elbow pain.  EXAM: CT HEAD WITHOUT CONTRAST  CT CERVICAL SPINE WITHOUT CONTRAST  TECHNIQUE: Multidetector CT imaging of the head and cervical spine was performed following the standard protocol without intravenous contrast. Multiplanar CT image reconstructions of the cervical spine were also generated.  COMPARISON:  04/25/2013 brain MR and CT. No prior cervical spine imaging.  FINDINGS: CT HEAD FINDINGS  Sinuses/Soft tissues: No significant soft tissue swelling. No skull fracture. Clear paranasal sinuses and mastoid air cells.  Intracranial: Expected cerebral volume loss for age. 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 A right cerebellar infarct is also remote and was acute on 04/25/2013 MRI.  No mass lesion, hemorrhage, hydrocephalus, acute infarct, intra-axial, or extra-axial fluid collection.  CT CERVICAL SPINE FINDINGS  Spinal visualization through the bottom of T1. Prevertebral soft tissues are within normal limits. No apical pneumothorax. Multilevel spondylosis. Central canal left-sided neural foraminal narrowing at C5-6.  Skull base intact. Osteopenia. Lucency through the posterior arch of C1 on image 17 of series 301 is favored to be the site of osseous fusion. Incompletely imaged but felt to be similar on 04/25/2013 head CT (image 1 of series 3).  Maintenance of vertebral body height. Trace C4-5 anterolisthesis, favored to be degenerative. Facets are well-aligned. Bilateral facet arthropathy, including at C3-5. Advanced degenerative changes about the C1-2 articulation.  IMPRESSION: 1. No acute intracranial abnormality. Remote infarcts including at the right parietal lobe and bilateral cerebellar hemispheres. 2. Advanced cervical spondylosis with likely secondary trace C4-5 anterolisthesis. No acute osseous  abnormality.   Electronically Signed   By: Abigail Miyamoto M.D.   On: 09/17/2014 10:50   Ct Cervical Spine Wo Contrast  09/17/2014   CLINICAL DATA:  Fall today, trauma to back of head. Right elbow pain.  EXAM: CT HEAD WITHOUT CONTRAST  CT CERVICAL SPINE WITHOUT CONTRAST  TECHNIQUE: Multidetector CT imaging of the head and cervical spine was performed following the standard protocol without intravenous contrast. Multiplanar CT image reconstructions of the cervical spine were also generated.  COMPARISON:  04/25/2013 brain MR and CT. No prior cervical spine imaging.  FINDINGS: CT HEAD FINDINGS  Sinuses/Soft tissues: No significant soft tissue swelling. No skull fracture. Clear paranasal sinuses and mastoid air cells.  Intracranial: Expected cerebral volume loss for age. 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 A right cerebellar infarct is also remote and was acute on 04/25/2013 MRI.  No mass lesion, hemorrhage, hydrocephalus, acute infarct, intra-axial, or extra-axial fluid collection.  CT CERVICAL SPINE FINDINGS  Spinal visualization through the bottom of T1. Prevertebral soft tissues are within normal limits. No apical pneumothorax. Multilevel spondylosis. Central canal left-sided neural foraminal narrowing at  C5-6.  Skull base intact. Osteopenia. Lucency through the posterior arch of C1 on image 17 of series 301 is favored to be the site of osseous fusion. Incompletely imaged but felt to be similar on 04/25/2013 head CT (image 1 of series 3).  Maintenance of vertebral body height. Trace C4-5 anterolisthesis, favored to be degenerative. Facets are well-aligned. Bilateral facet arthropathy, including at C3-5. Advanced degenerative changes about the C1-2 articulation.  IMPRESSION: 1. No acute intracranial abnormality. Remote infarcts including at the right parietal lobe and bilateral cerebellar hemispheres. 2. Advanced cervical spondylosis with likely secondary trace C4-5 anterolisthesis. No  acute osseous abnormality.   Electronically Signed   By: Abigail Miyamoto M.D.   On: 09/17/2014 10:50   Dg Hips Bilat With Pelvis 2v  09/17/2014   CLINICAL DATA:  Status post fall today with her niece day valve ; bilateral hip discomfort greater on the right than on the left.  EXAM: DG HIP W/ PELVIS 2V BILAT  COMPARISON:  Right hip series dated October 19, 2012.  FINDINGS: The bony pelvis is osteopenic. There is cortical irregularity of the superior and inferior pubic rami on the right consistent with nondisplaced fractures. The hip joint spaces are reasonably well maintained. AP and frog-leg lateral views of both hips reveal no acute fractures. The soft tissues of the pelvis are unremarkable.  IMPRESSION: There are nondisplaced fractures of the superior and inferior pubic rami on the right. No acute hip fracture is demonstrated.   Electronically Signed   By: David  Martinique   On: 09/17/2014 11:16    Positive ROS: All other systems have been reviewed and were otherwise negative with the exception of those mentioned in the HPI and as above.  Physical Exam: General: Alert, no acute distress Cardiovascular: No pedal edema Respiratory: No cyanosis, no use of accessory musculature GI: No organomegaly, abdomen is soft and non-tender Skin: No lesions in the area of chief complaint Neurologic: Sensation intact distally Psychiatric: Patient is competent for consent with normal mood and affect Lymphatic: No axillary or cervical lymphadenopathy  MUSCULOSKELETAL:  - BLE NVI - pelvic brim TTP - skin intact  Assessment: Right sup/inf rami fx  Plan: - nonop - WBAT - up with PT - pain control - f/u 6 weeks  Thank you for the consult and the opportunity to see Ms. Kalsey Lull. Eduard Roux, MD [place] 4:04 PM

## 2014-09-17 NOTE — ED Notes (Signed)
Attempted to ambulate patient with walker. Patient able to sit up but unable to tolerate bearing any weight or scooting to edge of bed.

## 2014-09-17 NOTE — ED Notes (Signed)
Per EMS pt lives at home alone used cane to ambulate. Daughter came over and pt std she had fallen on her bottom and was having some lower back pain. Pt alert and oriented, no neuro deficits or obvious deformities noted.

## 2014-09-17 NOTE — Progress Notes (Signed)
NURSING PROGRESS NOTE  Debbie Spears [id number] Admission Data: 09/17/2014 7:26 PM Attending Provider: Annia Belt, MD HUT:[id number] D, MD Code Status: DNR   Debbie Spears is a 81 y.o. female patient admitted from ED:  -No acute distress noted.  -No complaints of shortness of breath.  -No complaints of chest pain.   Blood pressure 161/62, pulse 66, temperature 98.5 F (36.9 C), temperature source Oral, resp. rate 16, height 5\' 2"  (1.575 m), weight 46.1 kg (101 lb 10.1 oz), SpO2 93 %.   IV Fluids:  IV in place, occlusive dsg intact without redness, IV cath antecubital left, condition patent and no redness none.   Allergies:  Olmesartan medoxomil  Past Medical History:   has a past medical history of Hypertension; Hyperlipidemia; Glucose intolerance (impaired glucose tolerance); Osteoporosis; Atrial fibrillation; Diastolic CHF, acute; Stroke (2014); Osteoarthritis; and Pelvic fracture (09/17/2014).  Past Surgical History:   has past surgical history that includes Laparoscopic cholecystectomy (March 02, 2007) and Cataract extraction w/ intraocular lens  implant, bilateral (Bilateral, 2000's).  Social History:   reports that she has never smoked. She has never used smokeless tobacco. She reports that she does not drink alcohol or use illicit drugs.  Skin: Skin tear to right elbow covered w/ foam dressing.  Patient/Family orientated to room. Information packet given to patient/family. Admission inpatient armband information verified with patient/family to include name and date of birth and placed on patient arm. Side rails up x 2, fall assessment and education completed with patient/family. Patient/family able to verbalize understanding of risk associated with falls and verbalized understanding to call for assistance before getting out of bed. Call light within reach. Patient/family able to voice and demonstrate understanding of unit orientation instructions.    Will  continue to evaluate and treat per MD orders.

## 2014-09-17 NOTE — Progress Notes (Addendum)
Pt c/o of trouble sleeping, requesting benadryl. MD page, order given for 12.5mg  of benadryl.

## 2014-09-17 NOTE — ED Provider Notes (Signed)
CSN: [id number]     Arrival date & time 09/17/14  0945 History   First MD Initiated Contact with Patient 09/17/14 0945     Chief Complaint  Patient presents with  . Fall     (Consider location/radiation/quality/duration/timing/severity/associated sxs/prior Treatment) HPI Comments: Patient presents to the ER for evaluation after a fall. Patient reports that her legs got tender left and she fell backwards, hitting her head on a linoleum floor. There was no loss of consciousness. She is complaining of back pain. She has not identified any extremity pain. Patient reports no chest pain, shortness of breath, palpitations before or after the fall. There was no syncope, she simply lost her balance.  Patient is a 81 y.o. female presenting with fall.  Fall Associated symptoms include headaches.    Past Medical History  Diagnosis Date  . Hypertension   . Hyperlipidemia   . Glucose intolerance (impaired glucose tolerance)   . Osteoporosis   . Osteoarthritis   . Atrial fibrillation   . Diastolic CHF, acute    Past Surgical History  Procedure Laterality Date  . Laparoscopic cholecystectomy  March 02, 2007   Family History  Problem Relation Age of Onset  . Stroke Mother   . Heart attack Brother    History  Substance Use Topics  . Smoking status: Never Smoker   . Smokeless tobacco: Not on file  . Alcohol Use: No   OB History    No data available     Review of Systems  Musculoskeletal: Positive for back pain.  Neurological: Positive for headaches.  All other systems reviewed and are negative.     Allergies  Olmesartan medoxomil  Home Medications   Prior to Admission medications   Medication Sig Start Date End Date Taking? Authorizing Provider  amLODipine (NORVASC) 5 MG tablet Take 1 tablet (5 mg total) by mouth daily. 08/08/14  Yes Fay Records, MD  apixaban (ELIQUIS) 2.5 MG TABS tablet Take 1 tablet (2.5 mg total) by mouth 2 (two) times daily. 08/08/14  Yes Fay Records,  MD  benazepril (LOTENSIN) 20 MG tablet Take 1 tablet (20 mg total) by mouth daily. 08/08/14  Yes Fay Records, MD  furosemide (LASIX) 40 MG tablet Take 1 tablet (40 mg total) by mouth daily. 08/08/14  Yes Fay Records, MD  hydrALAZINE (APRESOLINE) 25 MG tablet Take 1 tablet (25 mg total) by mouth 4 (four) times daily. 08/08/14  Yes Fay Records, MD  potassium chloride SA (KLOR-CON M20) 20 MEQ tablet Take 1 tablet (20 mEq total) by mouth daily. 08/08/14  Yes Fay Records, MD  Sennosides-Docusate Sodium (SENNA PLUS PO) Take by mouth as needed.   Yes Historical Provider, MD   BP 154/48 mmHg  Pulse 68  Temp(Src) 97.7 F (36.5 C) (Oral)  Resp 18  Ht 5\' 1"  (1.549 m)  Wt 101 lb (45.813 kg)  BMI 19.09 kg/m2  SpO2 94% Physical Exam  Constitutional: She is oriented to person, place, and time. She appears well-developed and well-nourished. No distress.  HENT:  Head: Normocephalic. Head is with contusion.    Right Ear: Hearing normal.  Left Ear: Hearing normal.  Nose: Nose normal.  Mouth/Throat: Oropharynx is clear and moist and mucous membranes are normal.  Eyes: Conjunctivae and EOM are normal. Pupils are equal, round, and reactive to light.  Neck: Normal range of motion. Neck supple.  Cardiovascular: Regular rhythm, S1 normal and S2 normal.  Exam reveals no gallop and no friction rub.  No murmur heard. Pulmonary/Chest: Effort normal and breath sounds normal. No respiratory distress. She exhibits no tenderness.  Abdominal: Soft. Normal appearance and bowel sounds are normal. There is no hepatosplenomegaly. There is no tenderness. There is no rebound, no guarding, no tenderness at McBurney's point and negative Murphy's sign. No hernia.  Musculoskeletal: Normal range of motion.       Lumbar back: She exhibits tenderness.       Back:  Neurological: She is alert and oriented to person, place, and time. She has normal strength. No cranial nerve deficit or sensory deficit. Coordination normal.  GCS eye subscore is 4. GCS verbal subscore is 5. GCS motor subscore is 6.  Skin: Skin is warm, dry and intact. No rash noted. No cyanosis.  Psychiatric: She has a normal mood and affect. Her speech is normal and behavior is normal. Thought content normal.  Nursing note and vitals reviewed.   ED Course  Procedures (including critical care time) Labs Review Labs Reviewed  CBC WITH DIFFERENTIAL - Abnormal; Notable for the following:    WBC 12.7 (*)    Neutrophils Relative % 86 (*)    Neutro Abs 11.0 (*)    Lymphocytes Relative 7 (*)    All other components within normal limits  BASIC METABOLIC PANEL - Abnormal; Notable for the following:    Glucose, Bld 119 (*)    BUN 26 (*)    Creatinine, Ser 1.54 (*)    GFR calc non Af Amer 27 (*)    GFR calc Af Amer 31 (*)    All other components within normal limits  URINALYSIS, ROUTINE W REFLEX MICROSCOPIC    Imaging Review Dg Chest 1 View  09/17/2014   CLINICAL DATA:  Status post fall today with tenderness in the upper and lower back  EXAM: CHEST - 1 VIEW  COMPARISON:  Portable chest x-ray of April 29, 2013.  FINDINGS: The lungs are mildly hyperinflated. There is no focal infiltrate. There is no pleural effusion or pneumothorax. The cardiac silhouette is enlarged but stable. The pulmonary vascularity is normal. There is calcification in the wall of the thoracic aorta. The bony structures are osteopenic. No acute rib fracture is demonstrated.  IMPRESSION: COPD and mild stable cardiac enlargement. There is no active cardiopulmonary disease.   Electronically Signed   By: David  Martinique   On: 09/17/2014 11:13   Dg Thoracic Spine 2 View  09/17/2014   CLINICAL DATA:  Acute lower back pain after fall today.  EXAM: THORACIC SPINE - 2 VIEW  COMPARISON:  None.  FINDINGS: Diffuse osteopenia is noted. No fracture or spondylolisthesis is noted. Ossification of anterior longitudinal ligament is noted suggesting diffuse idiopathic skeletal hyperostosis. Degenerative  disc disease at T12-L1 is noted with anterior osteophyte formation  IMPRESSION: No acute abnormality seen in the thoracic spine.   Electronically Signed   By: Sabino Dick M.D.   On: 09/17/2014 11:20   Dg Lumbar Spine Complete  09/17/2014   CLINICAL DATA:  Injury today.  Pain.  Initial evaluation.  EXAM: LUMBAR SPINE - COMPLETE 4+ VIEW  COMPARISON:  02/22/2007.  FINDINGS: Diffuse osteopenia and degenerative change noted. Degenerative changes are present at multiple levels. No acute bony abnormality. No evidence of fracture. Normal alignment. Surgical clips right upper quadrant.  IMPRESSION: Diffuse severe osteopenia and degenerative change. No acute abnormality.   Electronically Signed   By: Marcello Moores  Register   On: 09/17/2014 11:18   Ct Head Wo Contrast  09/17/2014   CLINICAL DATA:  Fall today, trauma to back of head. Right elbow pain.  EXAM: CT HEAD WITHOUT CONTRAST  CT CERVICAL SPINE WITHOUT CONTRAST  TECHNIQUE: Multidetector CT imaging of the head and cervical spine was performed following the standard protocol without intravenous contrast. Multiplanar CT image reconstructions of the cervical spine were also generated.  COMPARISON:  04/25/2013 brain MR and CT. No prior cervical spine imaging.  FINDINGS: CT HEAD FINDINGS  Sinuses/Soft tissues: No significant soft tissue swelling. No skull fracture. Clear paranasal sinuses and mastoid air cells.  Intracranial: Expected cerebral volume loss for age. 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 A right cerebellar infarct is also remote and was acute on 04/25/2013 MRI.  No mass lesion, hemorrhage, hydrocephalus, acute infarct, intra-axial, or extra-axial fluid collection.  CT CERVICAL SPINE FINDINGS  Spinal visualization through the bottom of T1. Prevertebral soft tissues are within normal limits. No apical pneumothorax. Multilevel spondylosis. Central canal left-sided neural foraminal narrowing at C5-6.  Skull base intact. Osteopenia. Lucency  through the posterior arch of C1 on image 17 of series 301 is favored to be the site of osseous fusion. Incompletely imaged but felt to be similar on 04/25/2013 head CT (image 1 of series 3).  Maintenance of vertebral body height. Trace C4-5 anterolisthesis, favored to be degenerative. Facets are well-aligned. Bilateral facet arthropathy, including at C3-5. Advanced degenerative changes about the C1-2 articulation.  IMPRESSION: 1. No acute intracranial abnormality. Remote infarcts including at the right parietal lobe and bilateral cerebellar hemispheres. 2. Advanced cervical spondylosis with likely secondary trace C4-5 anterolisthesis. No acute osseous abnormality.   Electronically Signed   By: Abigail Miyamoto M.D.   On: 09/17/2014 10:50   Ct Cervical Spine Wo Contrast  09/17/2014   CLINICAL DATA:  Fall today, trauma to back of head. Right elbow pain.  EXAM: CT HEAD WITHOUT CONTRAST  CT CERVICAL SPINE WITHOUT CONTRAST  TECHNIQUE: Multidetector CT imaging of the head and cervical spine was performed following the standard protocol without intravenous contrast. Multiplanar CT image reconstructions of the cervical spine were also generated.  COMPARISON:  04/25/2013 brain MR and CT. No prior cervical spine imaging.  FINDINGS: CT HEAD FINDINGS  Sinuses/Soft tissues: No significant soft tissue swelling. No skull fracture. Clear paranasal sinuses and mastoid air cells.  Intracranial: Expected cerebral volume loss for age. 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 A right cerebellar infarct is also remote and was acute on 04/25/2013 MRI.  No mass lesion, hemorrhage, hydrocephalus, acute infarct, intra-axial, or extra-axial fluid collection.  CT CERVICAL SPINE FINDINGS  Spinal visualization through the bottom of T1. Prevertebral soft tissues are within normal limits. No apical pneumothorax. Multilevel spondylosis. Central canal left-sided neural foraminal narrowing at C5-6.  Skull base intact.  Osteopenia. Lucency through the posterior arch of C1 on image 17 of series 301 is favored to be the site of osseous fusion. Incompletely imaged but felt to be similar on 04/25/2013 head CT (image 1 of series 3).  Maintenance of vertebral body height. Trace C4-5 anterolisthesis, favored to be degenerative. Facets are well-aligned. Bilateral facet arthropathy, including at C3-5. Advanced degenerative changes about the C1-2 articulation.  IMPRESSION: 1. No acute intracranial abnormality. Remote infarcts including at the right parietal lobe and bilateral cerebellar hemispheres. 2. Advanced cervical spondylosis with likely secondary trace C4-5 anterolisthesis. No acute osseous abnormality.   Electronically Signed   By: Abigail Miyamoto M.D.   On: 09/17/2014 10:50   Dg Hips Bilat With Pelvis 2v  09/17/2014   CLINICAL DATA:  Status post fall today with her niece day valve ; bilateral hip discomfort greater on the right than on the left.  EXAM: DG HIP W/ PELVIS 2V BILAT  COMPARISON:  Right hip series dated October 19, 2012.  FINDINGS: The bony pelvis is osteopenic. There is cortical irregularity of the superior and inferior pubic rami on the right consistent with nondisplaced fractures. The hip joint spaces are reasonably well maintained. AP and frog-leg lateral views of both hips reveal no acute fractures. The soft tissues of the pelvis are unremarkable.  IMPRESSION: There are nondisplaced fractures of the superior and inferior pubic rami on the right. No acute hip fracture is demonstrated.   Electronically Signed   By: David  Martinique   On: 09/17/2014 11:16     EKG Interpretation None      MDM   Final diagnoses:  Fall   inferior and superior pubic rami fractures, right side  Patient presents to the ER for evaluation after a fall. Patient had a mechanical fall while walking earlier. She reports falling over backwards and hitting her head. There was no loss of consciousness. She is on Eliquis for a history of  atrial fibrillation. CT head was performed, no evidence of bleeding. She is awake and alert. She did complain of back pain. CT cervical spine, x-ray of thoracic and lumbar spine was performed, no evidence of acute fracture or subluxation. X-ray of bilateral hips and pelvis reveals nondisplaced fractures of the superior and inferior pubic rami on the right. No hip fractures noted. Patient is unable to ambulate here in the ER secondary to pain. She cannot. He weighed on the pelvic fractures. She will require hospitalization for further management.  Discussed with Dr. Erlinda Hong, will consult on patient.  Orpah Greek, MD 09/17/14 [phone]

## 2014-09-17 NOTE — H&P (Signed)
Date: 09/17/2014               Patient Name:  Debbie Spears MRN: [id number]  DOB: 24-Mar-1912 Age / Sex: 81 y.o., female   PCP: Juanda Bond Altheimer, MD         Medical Service: Internal Medicine Teaching Service         Attending Physician: Dr. Annia Belt, MD    First Contact: Dr. Marvel Plan Pager: [phone]  Second Contact: Dr. Ronnald Ramp Pager: [phone]       After Hours (After 5p/  First Contact Pager: [phone]  weekends / holidays): Second Contact Pager: [phone]   Chief Complaint: fall  History of Present Illness: Ms. Debbie Spears is a 81 yo female with PMHx of HLD, HTN, Atrial Fibrillation, Diastolic CHF, OA, Osteoporosis, Vertigo and CVA who presents after a fall. Patient states this morning she was standing in her kitchen and turned and lost her balance. She fell backwards and landed on her right side and hit her head. She denies any loss of consciousness. Patient has a history of falls from imbalance. She denies any precipitating events or symptoms- she denies fever, chills, dizziness, lightheadedness, chest pain, shortness of breath, increased weakness, numbness or confusion. Patient has a history of CVA in 2013 with residual right lower extremity weakness. Patient and daughter state that she never fully regained her strength in that leg. Patient states her pain is well controlled at rest. It is currently a 3/10 and located over her posterior hip.   Meds: Medications Prior to Admission  Medication Sig Dispense Refill  . amLODipine (NORVASC) 5 MG tablet Take 1 tablet (5 mg total) by mouth daily. 90 tablet 1  . apixaban (ELIQUIS) 2.5 MG TABS tablet Take 1 tablet (2.5 mg total) by mouth 2 (two) times daily. 180 tablet 1  . benazepril (LOTENSIN) 20 MG tablet Take 1 tablet (20 mg total) by mouth daily. 90 tablet 1  . furosemide (LASIX) 40 MG tablet Take 1 tablet (40 mg total) by mouth daily. 90 tablet 1  . hydrALAZINE (APRESOLINE) 25 MG tablet Take 1 tablet (25 mg total) by mouth 4  (four) times daily. 360 tablet 1  . potassium chloride SA (KLOR-CON M20) 20 MEQ tablet Take 1 tablet (20 mEq total) by mouth daily. 90 tablet 1  . Sennosides-Docusate Sodium (SENNA PLUS PO) Take by mouth as needed.      Current Facility-Administered Medications  Medication Dose Route Frequency Provider Last Rate Last Dose  . acetaminophen (TYLENOL) tablet 650 mg  650 mg Oral Q6H PRN Corky Sox, MD       Or  . acetaminophen (TYLENOL) suppository 650 mg  650 mg Rectal Q6H PRN Corky Sox, MD      . Derrill Memo ON 09/18/2014] amLODipine (NORVASC) tablet 5 mg  5 mg Oral Daily Corky Sox, MD      . apixaban Arne Cleveland) tablet 2.5 mg  2.5 mg Oral BID Corky Sox, MD      . Derrill Memo ON 09/18/2014] benazepril (LOTENSIN) tablet 20 mg  20 mg Oral Daily Corky Sox, MD      . Derrill Memo ON 09/18/2014] furosemide (LASIX) tablet 40 mg  40 mg Oral Daily Corky Sox, MD      . hydrALAZINE (APRESOLINE) tablet 25 mg  25 mg Oral QID Corky Sox, MD      . oxyCODONE (Oxy IR/ROXICODONE) immediate release tablet 5 mg  5 mg Oral Q4H PRN Corky Sox, MD      . [  START ON 09/18/2014] potassium chloride SA (K-DUR,KLOR-CON) CR tablet 20 mEq  20 mEq Oral Daily Corky Sox, MD      . senna-docusate (Senokot-S) tablet 1 tablet  1 tablet Oral PRN Corky Sox, MD        Allergies: Allergies as of 09/17/2014 - Review Complete 09/17/2014  Allergen Reaction Noted  . Olmesartan medoxomil Diarrhea and Nausea And Vomiting 05/26/2010   Past Medical History  Diagnosis Date  . Hypertension   . Hyperlipidemia   . Glucose intolerance (impaired glucose tolerance)   . Osteoporosis   . Atrial fibrillation   . Diastolic CHF, acute   . Stroke 2014    "mini stroke"  . Osteoarthritis     "back mainly" (09/17/2014)  . Pelvic fracture 09/17/2014   Past Surgical History  Procedure Laterality Date  . Laparoscopic cholecystectomy  March 02, 2007  . Cataract extraction w/ intraocular lens  implant, bilateral Bilateral 2000's   Family History   Problem Relation Age of Onset  . Stroke Mother   . Heart attack Brother    History   Social History  . Marital Status: Widowed    Spouse Name: N/A    Number of Children: N/A  . Years of Education: N/A   Occupational History  . Not on file.   Social History Main Topics  . Smoking status: Never Smoker   . Smokeless tobacco: Never Used  . Alcohol Use: No  . Drug Use: No  . Sexual Activity: No   Other Topics Concern  . Not on file   Social History Narrative   Widow and lives alone. Daughter Debbie Spears and others call or check on her daily.     Review of Systems: General: Denies fever, chills, fatigue Respiratory: Denies SOB, cough Cardiovascular: Denies chest pain and palpitations.  Gastrointestinal: Denies nausea, vomiting, abdominal pain, diarrhea Genitourinary: Denies dysuria Musculoskeletal: Admits to right hip pain. Skin: Denies pallor, rash and wounds.  Neurological: Denies dizziness, headaches, weakness, lightheadedness, numbness, and syncope  Physical Exam: Filed Vitals:   09/17/14 1530 09/17/14 1600 09/17/14 1653 09/17/14 1655  BP: 151/52 139/52  174/55  Pulse: 68 70  66  Temp:    98.5 F (36.9 C)  TempSrc:    Oral  Resp: 18 16    Height:   5\' 2"  (1.575 m)   Weight:   46.1 kg (101 lb 10.1 oz)   SpO2: 95% 95%  93%   General: Vital signs reviewed.  Patient is a thin appearing female, in no acute distress and cooperative with exam.  Neck: No carotid bruit present.  Cardiovascular: RRR, S1 normal, S2 normal, no murmurs, gallops, or rubs. Pulmonary/Chest: Clear to auscultation bilaterally, no wheezes, rales, or rhonchi. Abdominal: Soft, non-tender, non-distended, BS + Musculoskeletal: Tenderness over posterior right hip, no breaks in skin, no ecchymoses.  Extremities: No lower extremity edema bilaterally Neurological: A&O x3 Skin: Warm, dry and intact. No rashes or erythema. Psychiatric: Normal mood and affect. speech and behavior is normal. Cognition and  memory are normal.   Lab results: Basic Metabolic Panel:  Recent Labs  09/17/14 1351  NA 145  K 4.0  CL 102  CO2 30  GLUCOSE 119*  BUN 26*  CREATININE 1.54*  CALCIUM 9.3   CBC:  Recent Labs  09/17/14 1351  WBC 12.7*  NEUTROABS 11.0*  HGB 13.7  HCT 42.2  MCV 96.8  PLT 153   Urinalysis:  Recent Labs  09/17/14 1325  COLORURINE YELLOW  LABSPEC 1.009  PHURINE 7.0  GLUCOSEU NEGATIVE  HGBUR NEGATIVE  BILIRUBINUR NEGATIVE  KETONESUR NEGATIVE  PROTEINUR NEGATIVE  UROBILINOGEN 0.2  NITRITE NEGATIVE  LEUKOCYTESUR NEGATIVE   Imaging results:  Dg Chest 1 View  09/17/2014   CLINICAL DATA:  Status post fall today with tenderness in the upper and lower back  EXAM: CHEST - 1 VIEW  COMPARISON:  Portable chest x-ray of April 29, 2013.  FINDINGS: The lungs are mildly hyperinflated. There is no focal infiltrate. There is no pleural effusion or pneumothorax. The cardiac silhouette is enlarged but stable. The pulmonary vascularity is normal. There is calcification in the wall of the thoracic aorta. The bony structures are osteopenic. No acute rib fracture is demonstrated.  IMPRESSION: COPD and mild stable cardiac enlargement. There is no active cardiopulmonary disease.   Electronically Signed   By: David  Martinique   On: 09/17/2014 11:13   Dg Thoracic Spine 2 View  09/17/2014   CLINICAL DATA:  Acute lower back pain after fall today.  EXAM: THORACIC SPINE - 2 VIEW  COMPARISON:  None.  FINDINGS: Diffuse osteopenia is noted. No fracture or spondylolisthesis is noted. Ossification of anterior longitudinal ligament is noted suggesting diffuse idiopathic skeletal hyperostosis. Degenerative disc disease at T12-L1 is noted with anterior osteophyte formation  IMPRESSION: No acute abnormality seen in the thoracic spine.   Electronically Signed   By: Sabino Dick M.D.   On: 09/17/2014 11:20   Dg Lumbar Spine Complete  09/17/2014   CLINICAL DATA:  Injury today.  Pain.  Initial evaluation.  EXAM: LUMBAR  SPINE - COMPLETE 4+ VIEW  COMPARISON:  02/22/2007.  FINDINGS: Diffuse osteopenia and degenerative change noted. Degenerative changes are present at multiple levels. No acute bony abnormality. No evidence of fracture. Normal alignment. Surgical clips right upper quadrant.  IMPRESSION: Diffuse severe osteopenia and degenerative change. No acute abnormality.   Electronically Signed   By: Marcello Moores  Register   On: 09/17/2014 11:18   Ct Head Wo Contrast  09/17/2014   CLINICAL DATA:  Fall today, trauma to back of head. Right elbow pain.  EXAM: CT HEAD WITHOUT CONTRAST  CT CERVICAL SPINE WITHOUT CONTRAST  TECHNIQUE: Multidetector CT imaging of the head and cervical spine was performed following the standard protocol without intravenous contrast. Multiplanar CT image reconstructions of the cervical spine were also generated.  COMPARISON:  04/25/2013 brain MR and CT. No prior cervical spine imaging.  FINDINGS: CT HEAD FINDINGS  Sinuses/Soft tissues: No significant soft tissue swelling. No skull fracture. Clear paranasal sinuses and mastoid air cells.  Intracranial: Expected cerebral volume loss for age. 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 A right cerebellar infarct is also remote and was acute on 04/25/2013 MRI.  No mass lesion, hemorrhage, hydrocephalus, acute infarct, intra-axial, or extra-axial fluid collection.  CT CERVICAL SPINE FINDINGS  Spinal visualization through the bottom of T1. Prevertebral soft tissues are within normal limits. No apical pneumothorax. Multilevel spondylosis. Central canal left-sided neural foraminal narrowing at C5-6.  Skull base intact. Osteopenia. Lucency through the posterior arch of C1 on image 17 of series 301 is favored to be the site of osseous fusion. Incompletely imaged but felt to be similar on 04/25/2013 head CT (image 1 of series 3).  Maintenance of vertebral body height. Trace C4-5 anterolisthesis, favored to be degenerative. Facets are well-aligned.  Bilateral facet arthropathy, including at C3-5. Advanced degenerative changes about the C1-2 articulation.  IMPRESSION: 1. No acute intracranial abnormality. Remote infarcts including at the right parietal lobe and bilateral  cerebellar hemispheres. 2. Advanced cervical spondylosis with likely secondary trace C4-5 anterolisthesis. No acute osseous abnormality.   Electronically Signed   By: Abigail Miyamoto M.D.   On: 09/17/2014 10:50   Ct Cervical Spine Wo Contrast  09/17/2014   CLINICAL DATA:  Fall today, trauma to back of head. Right elbow pain.  EXAM: CT HEAD WITHOUT CONTRAST  CT CERVICAL SPINE WITHOUT CONTRAST  TECHNIQUE: Multidetector CT imaging of the head and cervical spine was performed following the standard protocol without intravenous contrast. Multiplanar CT image reconstructions of the cervical spine were also generated.  COMPARISON:  04/25/2013 brain MR and CT. No prior cervical spine imaging.  FINDINGS: CT HEAD FINDINGS  Sinuses/Soft tissues: No significant soft tissue swelling. No skull fracture. Clear paranasal sinuses and mastoid air cells.  Intracranial: Expected cerebral volume loss for age. 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 A right cerebellar infarct is also remote and was acute on 04/25/2013 MRI.  No mass lesion, hemorrhage, hydrocephalus, acute infarct, intra-axial, or extra-axial fluid collection.  CT CERVICAL SPINE FINDINGS  Spinal visualization through the bottom of T1. Prevertebral soft tissues are within normal limits. No apical pneumothorax. Multilevel spondylosis. Central canal left-sided neural foraminal narrowing at C5-6.  Skull base intact. Osteopenia. Lucency through the posterior arch of C1 on image 17 of series 301 is favored to be the site of osseous fusion. Incompletely imaged but felt to be similar on 04/25/2013 head CT (image 1 of series 3).  Maintenance of vertebral body height. Trace C4-5 anterolisthesis, favored to be degenerative. Facets are  well-aligned. Bilateral facet arthropathy, including at C3-5. Advanced degenerative changes about the C1-2 articulation.  IMPRESSION: 1. No acute intracranial abnormality. Remote infarcts including at the right parietal lobe and bilateral cerebellar hemispheres. 2. Advanced cervical spondylosis with likely secondary trace C4-5 anterolisthesis. No acute osseous abnormality.   Electronically Signed   By: Abigail Miyamoto M.D.   On: 09/17/2014 10:50   Dg Hips Bilat With Pelvis 2v  09/17/2014   CLINICAL DATA:  Status post fall today with her niece day valve ; bilateral hip discomfort greater on the right than on the left.  EXAM: DG HIP W/ PELVIS 2V BILAT  COMPARISON:  Right hip series dated October 19, 2012.  FINDINGS: The bony pelvis is osteopenic. There is cortical irregularity of the superior and inferior pubic rami on the right consistent with nondisplaced fractures. The hip joint spaces are reasonably well maintained. AP and frog-leg lateral views of both hips reveal no acute fractures. The soft tissues of the pelvis are unremarkable.  IMPRESSION: There are nondisplaced fractures of the superior and inferior pubic rami on the right. No acute hip fracture is demonstrated.   Electronically Signed   By: David  Martinique   On: 09/17/2014 11:16    Assessment & Plan by Problem: Principal Problem:   Fracture of multiple pubic rami Active Problems:   Essential hypertension, benign   Atrial fibrillation   H/O: CVA (cerebrovascular accident)   CHF (congestive heart failure)   Pelvic fracture  Non-Displaced Pelvic Fracture: Patient presented to the ED after a mechanical fall without loss of consciousness. Vital signs on admission were 97.7, BP 169/47, HR 74, RR 18, 95% on room air. Labs revealed a leukocytosis of 12.7. Urinalysis showed no evidence of infection. Hip xray revealed a non-displaced fracture of superior and inferior pubic rami on the right with no acute hip fracture. The rest of imaging was unremarkable.  CXR showed COPD without acute cardiopulmonary disease. Lumbar  spine Xray showed severe osteopenia, degenerative changes without acute abnormalities. Lumbar spine xray showed no acute osseous abnormality. CT Head/Cervical spine showed no acute intracranial abnormality and no acute osseous abnormality. Patient received norco 5-325 mg 1/2 tablet in the ED. Patient was seen by Orthopedic Surgery who recommended non-operative conservative management, WBAT, up and ambulate with physical therapy, pain control and follow up with Ortho in 6 weeks.  -Tylenol 650 mg Q6H prn mild pain -Oxycodone 5 mg Q4H prn moderate pain -Bed Rest -PT Eval and Treat -Weight Bear As Tolerated -Up As Tolerated With Assistance  -Regular Diet -Follow up with Orthopedic Surgery in 6 weeks -Repeat CBC/BMET tomorrow am -Neurovascular checks  HTN: BP on admission 169/47. Patient is on Amlodipine 5 mg daily, Benzazepril 20 mg daily, Lasix 40 mg daily, and Hydralazine 25 mg QID at home. -Amlodipine 5 mg daily -Benazepril 20 mg daily -Lasix 40 mg daily -Hydralazine 25 mg QID  Chronic Diastolic CHF: Patient is on Lasix 40 mg daily, Kdur 20 mEq daily and Benazepril 20 mg daily at home. Patient is not currently in an acute exacerbation.  -Benazepril 20 mg daily -Lasix 40 mg daily  Atrial Fibrillation: Patient is currently in atrial fibrillation, but rate controlled. Patient is on Eliquis 2.5 mg BID at home.  -Continue Eliquis 2.5 mg BID -EKG tomorrow morning  DVT/PE ppx: Eliquis 2.5 mg BID  Dispo: Disposition is deferred at this time, awaiting improvement of current medical problems. Anticipated discharge in approximately 1-2 day(s).   The patient does have a current PCP Juanda Bond Altheimer, MD) and does not need an [facility] hospital follow-up appointment after discharge.  The patient does not have transportation limitations that hinder transportation to clinic appointments.  Signed: Osa Craver, DO PGY-1 Internal  Medicine Resident Pager # [phone] 09/17/2014 6:15 PM

## 2014-09-18 DIAGNOSIS — S32501A Unspecified fracture of right pubis, initial encounter for closed fracture: Secondary | ICD-10-CM

## 2014-09-18 DIAGNOSIS — I4891 Unspecified atrial fibrillation: Secondary | ICD-10-CM

## 2014-09-18 DIAGNOSIS — I5032 Chronic diastolic (congestive) heart failure: Secondary | ICD-10-CM

## 2014-09-18 DIAGNOSIS — I10 Essential (primary) hypertension: Secondary | ICD-10-CM

## 2014-09-18 DIAGNOSIS — W19XXXA Unspecified fall, initial encounter: Secondary | ICD-10-CM | POA: Insufficient documentation

## 2014-09-18 LAB — CBC
HCT: 40.9 % (ref 36.0–46.0)
Hemoglobin: 13.2 g/dL (ref 12.0–15.0)
MCH: 31.1 pg (ref 26.0–34.0)
MCHC: 32.3 g/dL (ref 30.0–36.0)
MCV: 96.5 fL (ref 78.0–100.0)
PLATELETS: 125 10*3/uL — AB (ref 150–400)
RBC: 4.24 MIL/uL (ref 3.87–5.11)
RDW: 14.2 % (ref 11.5–15.5)
WBC: 8.6 10*3/uL (ref 4.0–10.5)

## 2014-09-18 LAB — BASIC METABOLIC PANEL
Anion gap: 9 (ref 5–15)
BUN: 27 mg/dL — ABNORMAL HIGH (ref 6–23)
CALCIUM: 8.9 mg/dL (ref 8.4–10.5)
CHLORIDE: 101 meq/L (ref 96–112)
CO2: 32 mmol/L (ref 19–32)
Creatinine, Ser: 1.49 mg/dL — ABNORMAL HIGH (ref 0.50–1.10)
GFR, EST AFRICAN AMERICAN: 32 mL/min — AB (ref >90)
GFR, EST NON AFRICAN AMERICAN: 28 mL/min — AB (ref >90)
Glucose, Bld: 106 mg/dL — ABNORMAL HIGH (ref 70–99)
Potassium: 3.9 mmol/L (ref 3.5–5.1)
Sodium: 142 mmol/L (ref 135–145)

## 2014-09-18 MED ORDER — SENNOSIDES-DOCUSATE SODIUM 8.6-50 MG PO TABS: 1.0000 | ORAL_TABLET | Freq: Once | ORAL | Status: DC

## 2014-09-18 NOTE — Evaluation (Signed)
Physical Therapy Evaluation Patient Details Name: Debbie Spears MRN: [id number] DOB: 05-12-1912 Today's Date: 09/18/2014   History of Present Illness  Ms. Perdomo is a 81 yo female with PMHx of HLD, HTN, Atrial Fibrillation, Diastolic CHF, OA, Osteoporosis, Vertigo and CVA who presents after a fall. Patient states this morning she was standing in her kitchen and turned and lost her balance. She fell backwards and landed on her right side and hit her head. She denies any loss of consciousness. Patient has a history of falls from imbalance.  Imaging shows non displaced sup/inf. pubic rami fractures on the right.  Clinical Impression  Pt admitted with/for fall suffering pelvic fx's on the right.  Pt currently limited functionally due to the problems listed. ( See problems list.)   Pt will benefit from PT to maximize function and safety in order to get ready for next venue listed below.     Follow Up Recommendations SNF    Equipment Recommendations  None recommended by PT    Recommendations for Other Services       Precautions / Restrictions Precautions Precautions: Fall Restrictions Weight Bearing Restrictions: Yes RLE Weight Bearing: Weight bearing as tolerated LLE Weight Bearing: Weight bearing as tolerated      Mobility  Bed Mobility Overal bed mobility: Needs Assistance Bed Mobility: Supine to Sit     Supine to sit: Mod assist     General bed mobility comments: minimal assist to scoot to EOB and truncal assist to sit up first time.  Transfers Overall transfer level: Needs assistance Equipment used: Rolling walker (2 wheeled) Transfers: Sit to/from Stand Sit to Stand: Min assist         General transfer comment: cues for hand placement  Ambulation/Gait Ambulation/Gait assistance: Min assist Ambulation Distance (Feet): 7 Feet Assistive device: Rolling walker (2 wheeled) Gait Pattern/deviations: Step-to pattern;Step-through pattern     General Gait Details:  antalgic gait on R.  cues for better sequencing and worsening, but manageable pain with gait.  Stairs            Wheelchair Mobility    Modified Rankin (Stroke Patients Only)       Balance Overall balance assessment: Needs assistance Sitting-balance support: Feet supported;No upper extremity supported Sitting balance-Leahy Scale: Fair     Standing balance support: Bilateral upper extremity supported Standing balance-Leahy Scale: Poor Standing balance comment: Used RW and otherwise not tested                             Pertinent Vitals/Pain Pain Assessment: 0-10 Pain Score: 7  Pain Location: R pelvic area Pain Descriptors / Indicators: Discomfort Pain Intervention(s): Monitored during session    Home Living Family/patient expects to be discharged to:: Unsure Living Arrangements: Alone Available Help at Discharge: Family;Available PRN/intermittently Type of Home: House Home Access: Stairs to enter     Home Layout: One level Home Equipment: Environmental consultant - 2 wheels;Cane - single point;Bedside commode;Shower seat (lift chair)      Prior Function Level of Independence: Independent with assistive device(s)         Comments: uses cane to ambulate and often uses furniture in the house.     Hand Dominance        Extremity/Trunk Assessment   Upper Extremity Assessment: Defer to OT evaluation           Lower Extremity Assessment: Overall WFL for tasks assessed;Generalized weakness  Communication   Communication: HOH  Cognition Arousal/Alertness: Awake/alert Behavior During Therapy: WFL for tasks assessed/performed Overall Cognitive Status: Within Functional Limits for tasks assessed                      General Comments General comments (skin integrity, edema, etc.): Discussed the importance of keeping mobile, ankle pumps and exercise.    Exercises General Exercises - Lower Extremity Ankle Circles/Pumps: AROM;20  reps;Seated Heel Slides: AROM;10 reps;Both;Supine      Assessment/Plan    PT Assessment Patient needs continued PT services  PT Diagnosis Acute pain;Difficulty walking;Generalized weakness   PT Problem List Decreased strength;Decreased activity tolerance;Decreased mobility;Decreased knowledge of use of DME;Pain  PT Treatment Interventions DME instruction;Gait training;Functional mobility training;Therapeutic activities;Therapeutic exercise;Patient/family education;Balance training   PT Goals (Current goals can be found in the Care Plan section) Acute Rehab PT Goals Patient Stated Goal: ultimately back home PT Goal Formulation: With patient Time For Goal Achievement: 09/28/2014 Potential to Achieve Goals: Good    Frequency Min 3X/week   Barriers to discharge        Co-evaluation               End of Session   Activity Tolerance: Patient tolerated treatment well Patient left: in chair;with call bell/phone within reach;with family/visitor present Nurse Communication: Mobility status         Time: 0952-1029 PT Time Calculation (min) (ACUTE ONLY): 37 min   Charges:   PT Evaluation $Initial PT Evaluation Tier I: 1 Procedure PT Treatments $Gait Training: 8-22 mins $Therapeutic Activity: 8-22 mins   PT G Codes:        Berklee Battey, Tessie Fass 09/18/2014, 10:48 AM 09/18/2014  Donnella Sham, PT [phone]  (pager)

## 2014-09-18 NOTE — Progress Notes (Signed)
Subjective:  Debbie Spears was seen and examined this morning. Debbie Spears had trouble sleeping overnight due to be uncomfortable and not being to turn as she normally would. Currently, her pain is a 0/10, but she has increased pain when transferring from bed to chair and when working with PT. Debbie Spears was a little nauseous this morning, but states the breakfast was too greasy. She did tolerate a meal of grapes and ice cream. She denies any confusion, dizziness, chest pain, shortness of breath, or vomiting.   Objective: Vital signs in last 24 hours: Filed Vitals:   09/18/14 0614 09/18/14 0619 09/18/14 0644 09/18/14 1258  BP: 143/48   142/43  Pulse: 57   59  Temp:    97.6 F (36.4 C)  TempSrc:      Resp:    18  Height:      Weight:   43.9 kg (96 lb 12.5 oz)   SpO2: 87% 95%  90%   Weight change:   Intake/Output Summary (Last 24 hours) at 09/18/14 1416 Last data filed at 09/18/14 1347  Gross per 24 hour  Intake    465 ml  Output    800 ml  Net   -335 ml   General: Vital signs reviewed. Debbie Spears is a thin appearing female, in no acute distress and cooperative with exam.  Neck: No carotid bruit present.  Cardiovascular: RRR with PVCs Pulmonary/Chest: Bibasilar crackles, no wheezing or rhonchi. Abdominal: Soft, non-tender, non-distended, BS + Musculoskeletal: Tenderness over posterior right hip, no breaks in skin, no ecchymoses.  Extremities: No lower extremity edema bilaterally Neurological: A&O x3 Skin: Warm, dry and intact. No rashes or erythema. Psychiatric: Normal mood and affect. speech and behavior is normal. Cognition and memory are normal.   Lab Results: Basic Metabolic Panel:  Recent Labs Lab 09/17/14 1351 09/18/14 0945  NA 145 142  K 4.0 3.9  CL 102 101  CO2 30 32  GLUCOSE 119* 106*  BUN 26* 27*  CREATININE 1.54* 1.49*  CALCIUM 9.3 8.9   CBC:  Recent Labs Lab 09/17/14 1351 09/18/14 0945  WBC 12.7* 8.6  NEUTROABS 11.0*  --   HGB 13.7 13.2  HCT 42.2 40.9    MCV 96.8 96.5  PLT 153 125*   Urinalysis:  Recent Labs Lab 09/17/14 1325  COLORURINE YELLOW  LABSPEC 1.009  PHURINE 7.0  GLUCOSEU NEGATIVE  HGBUR NEGATIVE  BILIRUBINUR NEGATIVE  KETONESUR NEGATIVE  PROTEINUR NEGATIVE  UROBILINOGEN 0.2  NITRITE NEGATIVE  LEUKOCYTESUR NEGATIVE   Studies/Results: Dg Chest 1 View  09/17/2014   CLINICAL DATA:  Status post fall today with tenderness in the upper and lower back  EXAM: CHEST - 1 VIEW  COMPARISON:  Portable chest x-ray of April 29, 2013.  FINDINGS: The lungs are mildly hyperinflated. There is no focal infiltrate. There is no pleural effusion or pneumothorax. The cardiac silhouette is enlarged but stable. The pulmonary vascularity is normal. There is calcification in the wall of the thoracic aorta. The bony structures are osteopenic. No acute rib fracture is demonstrated.  IMPRESSION: COPD and mild stable cardiac enlargement. There is no active cardiopulmonary disease.   Electronically Signed   By: David  Martinique   On: 09/17/2014 11:13   Dg Thoracic Spine 2 View  09/17/2014   CLINICAL DATA:  Acute lower back pain after fall today.  EXAM: THORACIC SPINE - 2 VIEW  COMPARISON:  None.  FINDINGS: Diffuse osteopenia is noted. No fracture or spondylolisthesis is noted. Ossification of anterior longitudinal ligament is noted  suggesting diffuse idiopathic skeletal hyperostosis. Degenerative disc disease at T12-L1 is noted with anterior osteophyte formation  IMPRESSION: No acute abnormality seen in the thoracic spine.   Electronically Signed   By: Sabino Dick M.D.   On: 09/17/2014 11:20   Dg Lumbar Spine Complete  09/17/2014   CLINICAL DATA:  Injury today.  Pain.  Initial evaluation.  EXAM: LUMBAR SPINE - COMPLETE 4+ VIEW  COMPARISON:  02/22/2007.  FINDINGS: Diffuse osteopenia and degenerative change noted. Degenerative changes are present at multiple levels. No acute bony abnormality. No evidence of fracture. Normal alignment. Surgical clips right upper  quadrant.  IMPRESSION: Diffuse severe osteopenia and degenerative change. No acute abnormality.   Electronically Signed   By: Marcello Moores  Register   On: 09/17/2014 11:18   Ct Head Wo Contrast  09/17/2014   CLINICAL DATA:  Fall today, trauma to back of head. Right elbow pain.  EXAM: CT HEAD WITHOUT CONTRAST  CT CERVICAL SPINE WITHOUT CONTRAST  TECHNIQUE: Multidetector CT imaging of the head and cervical spine was performed following the standard protocol without intravenous contrast. Multiplanar CT image reconstructions of the cervical spine were also generated.  COMPARISON:  04/25/2013 brain MR and CT. No prior cervical spine imaging.  FINDINGS: CT HEAD FINDINGS  Sinuses/Soft tissues: No significant soft tissue swelling. No skull fracture. Clear paranasal sinuses and mastoid air cells.  Intracranial: Expected cerebral volume loss for age. 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 A right cerebellar infarct is also remote and was acute on 04/25/2013 MRI.  No mass lesion, hemorrhage, hydrocephalus, acute infarct, intra-axial, or extra-axial fluid collection.  CT CERVICAL SPINE FINDINGS  Spinal visualization through the bottom of T1. Prevertebral soft tissues are within normal limits. No apical pneumothorax. Multilevel spondylosis. Central canal left-sided neural foraminal narrowing at C5-6.  Skull base intact. Osteopenia. Lucency through the posterior arch of C1 on image 17 of series 301 is favored to be the site of osseous fusion. Incompletely imaged but felt to be similar on 04/25/2013 head CT (image 1 of series 3).  Maintenance of vertebral body height. Trace C4-5 anterolisthesis, favored to be degenerative. Facets are well-aligned. Bilateral facet arthropathy, including at C3-5. Advanced degenerative changes about the C1-2 articulation.  IMPRESSION: 1. No acute intracranial abnormality. Remote infarcts including at the right parietal lobe and bilateral cerebellar hemispheres. 2. Advanced  cervical spondylosis with likely secondary trace C4-5 anterolisthesis. No acute osseous abnormality.   Electronically Signed   By: Abigail Miyamoto M.D.   On: 09/17/2014 10:50   Ct Cervical Spine Wo Contrast  09/17/2014   CLINICAL DATA:  Fall today, trauma to back of head. Right elbow pain.  EXAM: CT HEAD WITHOUT CONTRAST  CT CERVICAL SPINE WITHOUT CONTRAST  TECHNIQUE: Multidetector CT imaging of the head and cervical spine was performed following the standard protocol without intravenous contrast. Multiplanar CT image reconstructions of the cervical spine were also generated.  COMPARISON:  04/25/2013 brain MR and CT. No prior cervical spine imaging.  FINDINGS: CT HEAD FINDINGS  Sinuses/Soft tissues: No significant soft tissue swelling. No skull fracture. Clear paranasal sinuses and mastoid air cells.  Intracranial: Expected cerebral volume loss for age. 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 A right cerebellar infarct is also remote and was acute on 04/25/2013 MRI.  No mass lesion, hemorrhage, hydrocephalus, acute infarct, intra-axial, or extra-axial fluid collection.  CT CERVICAL SPINE FINDINGS  Spinal visualization through the bottom of T1. Prevertebral soft tissues are within normal limits. No apical pneumothorax. Multilevel spondylosis.  Central canal left-sided neural foraminal narrowing at C5-6.  Skull base intact. Osteopenia. Lucency through the posterior arch of C1 on image 17 of series 301 is favored to be the site of osseous fusion. Incompletely imaged but felt to be similar on 04/25/2013 head CT (image 1 of series 3).  Maintenance of vertebral body height. Trace C4-5 anterolisthesis, favored to be degenerative. Facets are well-aligned. Bilateral facet arthropathy, including at C3-5. Advanced degenerative changes about the C1-2 articulation.  IMPRESSION: 1. No acute intracranial abnormality. Remote infarcts including at the right parietal lobe and bilateral cerebellar hemispheres. 2.  Advanced cervical spondylosis with likely secondary trace C4-5 anterolisthesis. No acute osseous abnormality.   Electronically Signed   By: Abigail Miyamoto M.D.   On: 09/17/2014 10:50   Dg Hips Bilat With Pelvis 2v  09/17/2014   CLINICAL DATA:  Status post fall today with her niece day valve ; bilateral hip discomfort greater on the right than on the left.  EXAM: DG HIP W/ PELVIS 2V BILAT  COMPARISON:  Right hip series dated October 19, 2012.  FINDINGS: The bony pelvis is osteopenic. There is cortical irregularity of the superior and inferior pubic rami on the right consistent with nondisplaced fractures. The hip joint spaces are reasonably well maintained. AP and frog-leg lateral views of both hips reveal no acute fractures. The soft tissues of the pelvis are unremarkable.  IMPRESSION: There are nondisplaced fractures of the superior and inferior pubic rami on the right. No acute hip fracture is demonstrated.   Electronically Signed   By: David  Martinique   On: 09/17/2014 11:16   Medications:  I have reviewed the Debbie Spears's current medications. Prior to Admission:  Prescriptions prior to admission  Medication Sig Dispense Refill Last Dose  . amLODipine (NORVASC) 5 MG tablet Take 1 tablet (5 mg total) by mouth daily. 90 tablet 1 09/17/2014 at Unknown time  . apixaban (ELIQUIS) 2.5 MG TABS tablet Take 1 tablet (2.5 mg total) by mouth 2 (two) times daily. 180 tablet 1 09/17/2014 at Unknown time  . benazepril (LOTENSIN) 20 MG tablet Take 1 tablet (20 mg total) by mouth daily. 90 tablet 1 09/17/2014 at Unknown time  . furosemide (LASIX) 40 MG tablet Take 1 tablet (40 mg total) by mouth daily. 90 tablet 1 09/17/2014 at Unknown time  . hydrALAZINE (APRESOLINE) 25 MG tablet Take 1 tablet (25 mg total) by mouth 4 (four) times daily. 360 tablet 1 09/17/2014 at Unknown time  . potassium chloride SA (KLOR-CON M20) 20 MEQ tablet Take 1 tablet (20 mEq total) by mouth daily. 90 tablet 1 09/17/2014 at Unknown time  .  Sennosides-Docusate Sodium (SENNA PLUS PO) Take by mouth as needed.   Past Month at Unknown time   Scheduled Meds: . amLODipine  5 mg Oral Daily  . apixaban  2.5 mg Oral BID  . benazepril  20 mg Oral Daily  . diphenhydrAMINE  12.5 mg Oral Once  . furosemide  40 mg Oral Daily  . hydrALAZINE  25 mg Oral QID  . potassium chloride SA  20 mEq Oral Daily   Continuous Infusions:  PRN Meds:.acetaminophen **OR** acetaminophen, oxyCODONE, senna-docusate Assessment/Plan: Principal Problem:   Fracture of multiple pubic rami Active Problems:   Essential hypertension, benign   Atrial fibrillation   H/O: CVA (cerebrovascular accident)   CHF (congestive heart failure)   Pelvic fracture  Non-Displaced Pelvic Fracture: Currently, Debbie Spears's pain is well controlled. Pain increases with ambulation but she was able to work with PT. PT  recommends SNF placement. Debbie Spears has only had 2 doses of Oxycodone 2.5 mg since yesterday. Hemoglobin remained stable overnight 13.7>13.2 this morning. Leukocytosis resolved 12.7>8.6 this morning.  -Oxycodone 2.5 mg Q4H prn moderate pain -PT/OT -Up As Tolerated With Assistance  -Regular Diet -Follow up with Orthopedic Surgery in 6 weeks -Repeat CBC/BMET tomorrow am -Neurovascular checks  HTN: BP well controlled 130-140s/40s. Debbie Spears is on Amlodipine 5 mg daily, Benzazepril 20 mg daily, Lasix 40 mg daily, and Hydralazine 25 mg QID at home. -Amlodipine 5 mg daily -Benazepril 20 mg daily -Lasix 40 mg daily -Hydralazine 25 mg QID  Chronic Diastolic CHF: Debbie Spears did require oxygen overnight 1-1.5 L via [place], but she denies any shortness of breath. Debbie Spears was breathing comfortably on room air when seen this morning, but she did have evidence of bibasilar crackles. Debbie Spears had not received Lasix yet. Debbie Spears had no lower extremity edema bilaterally. Debbie Spears is on Lasix 40 mg daily, Kdur 20 mEq daily and Benazepril 20 mg daily at home.  -Continue to monitor -Benazepril 20 mg  daily -Lasix 40 mg daily  Atrial Fibrillation: Debbie Spears has a history of atrial fibrillation. Debbie Spears is on Eliquis 2.5 mg BID at home. Hemoglobin stable 13.7>13.2 this morning.  EKG this morning appears to be Atrial Flutter with a 5:1 block.  -Continue Eliquis 2.5 mg BID  DVT/PE ppx: Eliquis 2.5 mg BID  Dispo: Disposition is deferred at this time, awaiting improvement of current medical problems.  Anticipated discharge in approximately 1-2 day(s).   The Debbie Spears does have a current PCP Juanda Bond Altheimer, MD) and does not need an [facility] hospital follow-up appointment after discharge.  The Debbie Spears does have transportation limitations that hinder transportation to clinic appointments.  .Services Needed at time of discharge: Y = Yes, Blank = No PT:   OT:   RN:   Equipment:   Other:     LOS: 1 day   Osa Craver, DO PGY-1 Internal Medicine Resident Pager # [phone] 09/18/2014 2:16 PM

## 2014-09-18 NOTE — Clinical Social Work Placement (Addendum)
Clinical Social Work Department CLINICAL SOCIAL WORK PLACEMENT NOTE 09/18/2014  Patient:  Debbie Spears, Debbie Spears  Account Number:  [id number] Admit date:  09/17/2014  Clinical Social Worker:  Kemper Durie, [place]  Date/time:  09/18/2014 04:00 PM  Clinical Social Work is seeking post-discharge placement for this patient at the following level of care:   [facility]   (*CSW will update this form in Epic as items are completed)   09/18/2014  Patient/family provided with [place] Department of Clinical Social Work's list of facilities offering this level of care within the geographic area requested by the patient (or if unable, by the patient's family).  09/18/2014  Patient/family informed of their freedom to choose among providers that offer the needed level of care, that participate in Medicare, Medicaid or managed care program needed by the patient, have an available bed and are willing to accept the patient.  09/18/2014  Patient/family informed of [facility]' ownership interest in [facility], as well as of the fact that they are under no obligation to receive care at this facility.  PASARR submitted to EDS on  PASARR number received on   FL2 transmitted to all facilities in geographic area requested by pt/family on  09/18/2014 FL2 transmitted to all facilities within larger geographic area on   Patient informed that his/her managed care company has contracts with or will negotiate with  certain facilities, including the following:     Patient/family informed of bed offers received:  09/19/2014 Patient chooses bed at  [facility] Physician recommends and patient chooses bed at    Patient to be transferred to [facility] on  09/20/2014   Patient to be transferred to facility by [facility] - ambulance Patient and family notified of transfer on  09/19/2014 Name of family member notified:  Remo Lipps - patient daughter at bedside  The following physician request were  entered in Epic:   Additional Comments:    Liz Beach MSW, [place], [place], [phone]

## 2014-09-18 NOTE — Clinical Social Work Psychosocial (Signed)
Clinical Social Work Department BRIEF PSYCHOSOCIAL ASSESSMENT 09/18/2014  Patient:  Debbie Spears, Debbie Spears     Account Number:  [id number]     Admit date:  09/17/2014  Clinical Social Worker:  Lovey Newcomer  Date/Time:  09/18/2014 04:19 PM  Referred by:  Physician  Date Referred:  09/18/2014 Referred for  SNF Placement   Other Referral:   NA   Interview type:  Patient Other interview type:   Patient and daughter interviewed at bedside to complete assessment.    PSYCHOSOCIAL DATA Living Status:  ALONE Admitted from facility:   Level of care:   Primary support name:  Remo Lipps Primary support relationship to patient:  CHILD, ADULT Degree of support available:   Support is good.    CURRENT CONCERNS Current Concerns  Post-Acute Placement   Other Concerns:   NA    SOCIAL WORK ASSESSMENT / PLAN CSW met with patient and patient's daughter at bedside to complete assessment. Patient currently lives alone and but daughter and patient are agreeable to SNF placement with [facility] when medically stable. The patient has been to this facility before in the past and was happy with the care she received there. CSW explained SNF search/placement process and answered questions. Patient and daughter appeared calm and engaged in assessment.   Assessment/plan status:  Psychosocial Support/Ongoing Assessment of Needs Other assessment/ plan:   Complete Fl2, Fax, PASRR   Information/referral to community resources:   CSW contact information and SNF list given.    PATIENT'S/FAMILY'S RESPONSE TO PLAN OF CARE: Patient and daughter agreeable to SNF placement when medically stable. CSW will assist.       Liz Beach MSW, [place], [place], [phone]

## 2014-09-18 NOTE — Care Management Note (Signed)
    Page 1 of 1   09/19/2014     2:08:06 PM CARE MANAGEMENT NOTE 09/19/2014  Patient:  Debbie Spears, Debbie Spears   Account Number:  [id number]  Date Initiated:  09/18/2014  Documentation initiated by:  GRAVES-BIGELOW,BRENDA  Subjective/Objective Assessment:   Pt admitted for fall at home resulting in pelvic fx. Plan for pain control and WBAT. Pt lives at home alone and has support of daughter.     Action/Plan:   CM will continue to monitor for disposition needs.   Anticipated DC Date:  09/19/2014   Anticipated DC Plan:  SKILLED NURSING FACILITY  In-house referral  Clinical Social Worker      DC Planning Services  CM consult      Choice offered to / List presented to:             Status of service:  Completed, signed off Medicare Important Message given?  YES (If response is "NO", the following Medicare IM given date fields will be blank) Date Medicare IM given:  09/19/2014 Medicare IM given by:  Tomi Bamberger Date Additional Medicare IM given:   Additional Medicare IM given by:    Discharge Disposition:  [place]  Per UR Regulation:  Reviewed for med. necessity/level of care/duration of stay  If discussed at [place] of Stay Meetings, dates discussed:    Comments:  09/19/14 [place], BSN [phone]  patient is for dc to [facility] today per CSW note.  NCM left private duty list with patient in room.

## 2014-09-18 NOTE — Progress Notes (Signed)
OT Cancellation Note  Patient Details Name: Debbie Spears MRN: [id number] DOB: 1912/04/06   Cancelled Treatment:    Reason Eval/Treat Not Completed: Other (comment) Pt is Medicare and current D/C plan is SNF. No apparent immediate acute care OT needs, therefore will defer OT to SNF. If OT eval is needed please call Acute Rehab Dept. at [phone] or text page OT at [phone].    [place] Herb M   Cyndie Chime, OTR/L Occupational Therapist [phone] (pager)  09/18/2014, 5:15 PM

## 2014-09-18 NOTE — Progress Notes (Signed)
UR Completed Iline Buchinger Graves-Bigelow, RN,BSN [phone]  

## 2014-09-18 NOTE — Progress Notes (Signed)
Pt BP 135/42, HR 58, O2 Sat 87 on Room Air. Pt on 1.5L Nasal Cannula O2 Sat 95. MD made aware, no new orders given, will continue to monitor and make day RN aware.

## 2014-09-19 DIAGNOSIS — S32599A Other specified fracture of unspecified pubis, initial encounter for closed fracture: Secondary | ICD-10-CM | POA: Insufficient documentation

## 2014-09-19 MED ORDER — ACETAMINOPHEN 325 MG PO TABS: 650.0000 mg | ORAL_TABLET | Freq: Four times a day (QID) | ORAL | Status: AC | PRN

## 2014-09-19 MED ORDER — OXYCODONE HCL 5 MG PO TABS: 2.5000 mg | ORAL_TABLET | ORAL | Status: DC | PRN

## 2014-09-19 NOTE — Discharge Summary (Signed)
Name: Debbie Spears MRN: [id number] DOB: Nov 09, 1911 81 y.o. PCP: Juanda Bond Altheimer, MD  Date of Admission: 09/17/2014  9:45 AM Date of Discharge: 09/20/2014 Attending Physician: Annia Belt, MD  Discharge Diagnosis:  Principal Problem:   Fracture of multiple pubic rami Active Problems:   Essential hypertension, benign   Atrial fibrillation   H/O: CVA (cerebrovascular accident)   CHF (congestive heart failure)   Pelvic fracture   Fall   Closed fracture of multiple pubic rami  Discharge Medications:   Medication List    TAKE these medications        acetaminophen 325 MG tablet  Commonly known as:  TYLENOL  Take 2 tablets (650 mg total) by mouth every 6 (six) hours as needed for mild pain (or Fever >/= 101).     amLODipine 5 MG tablet  Commonly known as:  NORVASC  Take 1 tablet (5 mg total) by mouth daily.     apixaban 2.5 MG Tabs tablet  Commonly known as:  ELIQUIS  Take 1 tablet (2.5 mg total) by mouth 2 (two) times daily.     benazepril 20 MG tablet  Commonly known as:  LOTENSIN  Take 1 tablet (20 mg total) by mouth daily.     furosemide 40 MG tablet  Commonly known as:  LASIX  Take 1 tablet (40 mg total) by mouth daily.     hydrALAZINE 25 MG tablet  Commonly known as:  APRESOLINE  Take 1 tablet (25 mg total) by mouth 4 (four) times daily.     oxyCODONE 5 MG immediate release tablet  Commonly known as:  Oxy IR/ROXICODONE  Take 0.5 tablets (2.5 mg total) by mouth every 4 (four) hours as needed for moderate pain.     potassium chloride SA 20 MEQ tablet  Commonly known as:  KLOR-CON M20  Take 1 tablet (20 mEq total) by mouth daily.     SENNA PLUS PO  Take by mouth as needed.        Disposition and follow-up:   Ms.Celicia B Schlee was discharged from [facility] in Good condition.  At the hospital follow up visit please address:  1.  Non-Displaced Pelvic Fracture: Please assess pain control and improvement of strength and  ambulation with physical therapy.  2.  Labs / imaging needed at time of follow-up: None  3.  Pending labs/ test needing follow-up: None  Follow-up Appointments: Follow-up Information    Follow up with Marianna Payment, MD On 10/31/2014.   Specialty:  Orthopedic Surgery   Why:  10:45 am   Contact information:   [address] [place] [address] [phone]       Follow up with Limmie Patricia, MD On 10/03/2014.   Specialty:  Endocrinology   Why:  12:40 pm   Contact information:   [place] [phone]      Discharge Instructions    Diet - low sodium heart healthy    Complete by:  As directed      Diet - low sodium heart healthy    Complete by:  As directed      Increase activity slowly    Complete by:  As directed      Increase activity slowly    Complete by:  As directed           Consultations:  Orthopedics  Procedures Performed:  Dg Chest 1 View  09/17/2014   CLINICAL DATA:  Status  post fall today with tenderness in the upper and lower back  EXAM: CHEST - 1 VIEW  COMPARISON:  Portable chest x-ray of April 29, 2013.  FINDINGS: The lungs are mildly hyperinflated. There is no focal infiltrate. There is no pleural effusion or pneumothorax. The cardiac silhouette is enlarged but stable. The pulmonary vascularity is normal. There is calcification in the wall of the thoracic aorta. The bony structures are osteopenic. No acute rib fracture is demonstrated.  IMPRESSION: COPD and mild stable cardiac enlargement. There is no active cardiopulmonary disease.   Electronically Signed   By: David  Martinique   On: 09/17/2014 11:13   Dg Thoracic Spine 2 View  09/17/2014   CLINICAL DATA:  Acute lower back pain after fall today.  EXAM: THORACIC SPINE - 2 VIEW  COMPARISON:  None.  FINDINGS: Diffuse osteopenia is noted. No fracture or spondylolisthesis is noted. Ossification of anterior longitudinal ligament is noted suggesting diffuse  idiopathic skeletal hyperostosis. Degenerative disc disease at T12-L1 is noted with anterior osteophyte formation  IMPRESSION: No acute abnormality seen in the thoracic spine.   Electronically Signed   By: Sabino Dick M.D.   On: 09/17/2014 11:20   Dg Lumbar Spine Complete  09/17/2014   CLINICAL DATA:  Injury today.  Pain.  Initial evaluation.  EXAM: LUMBAR SPINE - COMPLETE 4+ VIEW  COMPARISON:  02/22/2007.  FINDINGS: Diffuse osteopenia and degenerative change noted. Degenerative changes are present at multiple levels. No acute bony abnormality. No evidence of fracture. Normal alignment. Surgical clips right upper quadrant.  IMPRESSION: Diffuse severe osteopenia and degenerative change. No acute abnormality.   Electronically Signed   By: Marcello Moores  Register   On: 09/17/2014 11:18   Ct Head Wo Contrast  09/17/2014   CLINICAL DATA:  Fall today, trauma to back of head. Right elbow pain.  EXAM: CT HEAD WITHOUT CONTRAST  CT CERVICAL SPINE WITHOUT CONTRAST  TECHNIQUE: Multidetector CT imaging of the head and cervical spine was performed following the standard protocol without intravenous contrast. Multiplanar CT image reconstructions of the cervical spine were also generated.  COMPARISON:  04/25/2013 brain MR and CT. No prior cervical spine imaging.  FINDINGS: CT HEAD FINDINGS  Sinuses/Soft tissues: No significant soft tissue swelling. No skull fracture. Clear paranasal sinuses and mastoid air cells.  Intracranial: Expected cerebral volume loss for age. 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 A right cerebellar infarct is also remote and was acute on 04/25/2013 MRI.  No mass lesion, hemorrhage, hydrocephalus, acute infarct, intra-axial, or extra-axial fluid collection.  CT CERVICAL SPINE FINDINGS  Spinal visualization through the bottom of T1. Prevertebral soft tissues are within normal limits. No apical pneumothorax. Multilevel spondylosis. Central canal left-sided neural foraminal narrowing at  C5-6.  Skull base intact. Osteopenia. Lucency through the posterior arch of C1 on image 17 of series 301 is favored to be the site of osseous fusion. Incompletely imaged but felt to be similar on 04/25/2013 head CT (image 1 of series 3).  Maintenance of vertebral body height. Trace C4-5 anterolisthesis, favored to be degenerative. Facets are well-aligned. Bilateral facet arthropathy, including at C3-5. Advanced degenerative changes about the C1-2 articulation.  IMPRESSION: 1. No acute intracranial abnormality. Remote infarcts including at the right parietal lobe and bilateral cerebellar hemispheres. 2. Advanced cervical spondylosis with likely secondary trace C4-5 anterolisthesis. No acute osseous abnormality.   Electronically Signed   By: Abigail Miyamoto M.D.   On: 09/17/2014 10:50   Ct Cervical Spine Wo Contrast  09/17/2014  CLINICAL DATA:  Fall today, trauma to back of head. Right elbow pain.  EXAM: CT HEAD WITHOUT CONTRAST  CT CERVICAL SPINE WITHOUT CONTRAST  TECHNIQUE: Multidetector CT imaging of the head and cervical spine was performed following the standard protocol without intravenous contrast. Multiplanar CT image reconstructions of the cervical spine were also generated.  COMPARISON:  04/25/2013 brain MR and CT. No prior cervical spine imaging.  FINDINGS: CT HEAD FINDINGS  Sinuses/Soft tissues: No significant soft tissue swelling. No skull fracture. Clear paranasal sinuses and mastoid air cells.  Intracranial: Expected cerebral volume loss for age. 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 A right cerebellar infarct is also remote and was acute on 04/25/2013 MRI.  No mass lesion, hemorrhage, hydrocephalus, acute infarct, intra-axial, or extra-axial fluid collection.  CT CERVICAL SPINE FINDINGS  Spinal visualization through the bottom of T1. Prevertebral soft tissues are within normal limits. No apical pneumothorax. Multilevel spondylosis. Central canal left-sided neural foraminal  narrowing at C5-6.  Skull base intact. Osteopenia. Lucency through the posterior arch of C1 on image 17 of series 301 is favored to be the site of osseous fusion. Incompletely imaged but felt to be similar on 04/25/2013 head CT (image 1 of series 3).  Maintenance of vertebral body height. Trace C4-5 anterolisthesis, favored to be degenerative. Facets are well-aligned. Bilateral facet arthropathy, including at C3-5. Advanced degenerative changes about the C1-2 articulation.  IMPRESSION: 1. No acute intracranial abnormality. Remote infarcts including at the right parietal lobe and bilateral cerebellar hemispheres. 2. Advanced cervical spondylosis with likely secondary trace C4-5 anterolisthesis. No acute osseous abnormality.   Electronically Signed   By: Abigail Miyamoto M.D.   On: 09/17/2014 10:50   Dg Hips Bilat With Pelvis 2v  09/17/2014   CLINICAL DATA:  Status post fall today with her niece day valve ; bilateral hip discomfort greater on the right than on the left.  EXAM: DG HIP W/ PELVIS 2V BILAT  COMPARISON:  Right hip series dated October 19, 2012.  FINDINGS: The bony pelvis is osteopenic. There is cortical irregularity of the superior and inferior pubic rami on the right consistent with nondisplaced fractures. The hip joint spaces are reasonably well maintained. AP and frog-leg lateral views of both hips reveal no acute fractures. The soft tissues of the pelvis are unremarkable.  IMPRESSION: There are nondisplaced fractures of the superior and inferior pubic rami on the right. No acute hip fracture is demonstrated.   Electronically Signed   By: David  Martinique   On: 09/17/2014 11:16    Admission HPI: Ms. Runyon is a 81 yo female with PMHx of HLD, HTN, Atrial Fibrillation, Diastolic CHF, OA, Osteoporosis, Vertigo and CVA who presents after a fall. Patient states this morning she was standing in her kitchen and turned and lost her balance. She fell backwards and landed on her right side and hit her head. She  denies any loss of consciousness. Patient has a history of falls from imbalance. She denies any precipitating events or symptoms- she denies fever, chills, dizziness, lightheadedness, chest pain, shortness of breath, increased weakness, numbness or confusion. Patient has a history of CVA in 2013 with residual right lower extremity weakness. Patient and daughter state that she never fully regained her strength in that leg. Patient states her pain is well controlled at rest. It is currently a 3/10 and located over her posterior hip.   Hospital Course by problem list: Principal Problem:   Fracture of multiple pubic rami Active Problems:   Essential  hypertension, benign   Atrial fibrillation   H/O: CVA (cerebrovascular accident)   CHF (congestive heart failure)   Pelvic fracture   Fall   Closed fracture of multiple pubic rami   Non-Displaced Pelvic Fracture: Patient presented to the ED after a mechanical fall without loss of consciousness. Vital signs on admission were 97.7, BP 169/47, HR 74, RR 18, 95% on room air. Labs revealed a leukocytosis of 12.7. Urinalysis showed no evidence of infection. Hip xray revealed a non-displaced fracture of superior and inferior pubic rami on the right with no acute hip fracture. The rest of imaging was unremarkable. CXR showed COPD without acute cardiopulmonary disease. Lumbar spine Xray showed severe osteopenia, degenerative changes without acute abnormalities. Lumbar spine xray showed no acute osseous abnormality. CT Head/Cervical spine showed no acute intracranial abnormality and no acute osseous abnormality. Patient was seen by Orthopedic Surgery who recommended non-operative conservative management, weight bearing as tolerated, up and ambulate with physical therapy, pain control and follow up with Ortho in 6 weeks. Patient worked with PT who recommended SNF. Patient's pain was well controlled at rest, but increased with ambulation and working with PT. We will  prescribe Tylenol 650 mg Q6H prn mild pain and Oxycodone 2.5 mg Q4H prn moderate pain on discharge. Patient does not ask for pain medication when she is in pain. Please ask her frequently before and after PT sessions how her pain is controlled. At SNF, patient can weight bear as tolerated, up with assistance and work with PT. Patient will follow up with Orthopedics and PCP.   HTN: BP on admission 169/47 which improved and remained normotensive during admission. Patient is on Amlodipine 5 mg daily, Benzazepril 20 mg daily, Lasix 40 mg daily, and Hydralazine 25 mg QID at home. These medications were continued during stay and on discharge.   Chronic Diastolic CHF: Patient is on Lasix 40 mg daily, Kdur 20 mEq daily and Benazepril 20 mg daily at home. Patient was continued on her home medications during admission and on discharge.  Atrial Fibrillation: Patient has a history of atrial fibrillation and is on Eliquis 2.5 mg BID at home. Per EKG review, patient is likely in atrial flutter with a 5:1 block. Patient is asymptomatic and rate controlled. Her hemoglobin remained stable at 13. Patient was continued on and discharged on Eliquis 2.5 mg BID.   Discharge Vitals:   BP 149/38 mmHg  Pulse 58  Temp(Src) 97.5 F (36.4 C) (Oral)  Resp 20  Ht 5\' 2"  (1.575 m)  Wt 97 lb 14.2 oz (44.4 kg)  BMI 17.90 kg/m2  SpO2 95%  Signed: Luanne Bras, MD 09/20/2014 12:07 PM

## 2014-09-19 NOTE — Clinical Social Work Note (Addendum)
Clinical Social Worker met with patient and patient daughter to provide available bed offers.  Patient plans to discharge to [facility] on Saturday September 20, 2014.  Patient family and facility prepared.  Patient daughter plans to go complete paperwork at facility today.  CSW to Sales promotion account executive [phone]) at [facility] for weekend discharge.  Barbette Or, [place]

## 2014-09-19 NOTE — Progress Notes (Signed)
Patient ID: Debbie Spears, female   DOB: 17-Aug-1912, 81 y.o.   MRN: [id number] Medicine attending discharge note: I personally interviewed and examined this lady on the day of discharge and I attest to the accuracy of the discharge evaluation and plan as recorded by resident physician Dr. Osa Craver.  Clinical summary: 81 year old woman in overall excellent health who has been unsteady on her feet since a stroke 3 years ago resulting in chronic right lower extremity weakness and ataxia. She fell down at home and sustained nondisplaced fractures of the right superior and inferior pubic rami. She was seen in consultation by orthopedic surgery. Surgery was not indicated. She has a history of atrial fibrillation on chronic apixiban anticoagulation. This was continued as her primary thromboprophylaxis. She was given small doses of oxycodone as needed for pain. Antihypertensives were continued. Laxatives were administered with good result for constipation. She was evaluated by physical and occupational therapy. Short-term, outpatient, rehabilitation recommended. She is discharged in stable condition directly to an extended care facility for rehabilitation. She will continue to follow-up with her primary care physician and also has a appointment scheduled with orthopedic surgery. There were no complications.

## 2014-09-19 NOTE — Progress Notes (Signed)
Subjective:  Patient was seen and examined this morning. Patient slept well overnight. Her pain is well controlled at rest, but exacerbated by movement. Oxycodone 2.5 mg helps ease the pain. Patient denies any complaints- she denies shortness of breath, chest pain. Had a large bowel movement yesterday.  Objective: Vital signs in last 24 hours: Filed Vitals:   09/18/14 2057 09/18/14 2301 09/19/14 0527 09/19/14 1007  BP: 158/49 125/42 126/37 140/59  Pulse: 63  61   Temp: 99.1 F (37.3 C) 97.6 F (36.4 C) 98.3 F (36.8 C)   TempSrc: Oral  Oral   Resp: 21  12   Height:      Weight:   43.3 kg (95 lb 7.4 oz)   SpO2: 90%  97%    Weight change: -2.513 kg (-5 lb 8.7 oz)  Intake/Output Summary (Last 24 hours) at 09/19/14 1203 Last data filed at 09/19/14 1049  Gross per 24 hour  Intake    960 ml  Output    750 ml  Net    210 ml   General: Vital signs reviewed. Patient is a thin appearing female, in no acute distress and cooperative with exam.  Neck: No carotid bruit present.  Cardiovascular: +S1 +S2 Pulmonary/Chest: Bibasilar crackles, no wheezing or rhonchi. Abdominal: Soft, non-tender, non-distended, BS + Musculoskeletal: Tenderness over posterior right hip, no breaks in skin, no ecchymoses.  Extremities: No lower extremity edema bilaterally. Strong dorsalis pedis and posterior tibialis pulses bilaterally, normal sensation. Neurological: A&O x3 Skin: Warm, dry and intact. No rashes or erythema. Psychiatric: Normal mood and affect. speech and behavior is normal. Cognition and memory are normal.   Lab Results: Basic Metabolic Panel:  Recent Labs Lab 09/17/14 1351 09/18/14 0945  NA 145 142  K 4.0 3.9  CL 102 101  CO2 30 32  GLUCOSE 119* 106*  BUN 26* 27*  CREATININE 1.54* 1.49*  CALCIUM 9.3 8.9   CBC:  Recent Labs Lab 09/17/14 1351 09/18/14 0945  WBC 12.7* 8.6  NEUTROABS 11.0*  --   HGB 13.7 13.2  HCT 42.2 40.9  MCV 96.8 96.5  PLT 153 125*    Urinalysis:  Recent Labs Lab 09/17/14 1325  COLORURINE YELLOW  LABSPEC 1.009  PHURINE 7.0  GLUCOSEU NEGATIVE  HGBUR NEGATIVE  BILIRUBINUR NEGATIVE  KETONESUR NEGATIVE  PROTEINUR NEGATIVE  UROBILINOGEN 0.2  NITRITE NEGATIVE  LEUKOCYTESUR NEGATIVE   Studies/Results: No results found. Medications:  I have reviewed the patient's current medications. Prior to Admission:  Prescriptions prior to admission  Medication Sig Dispense Refill Last Dose  . amLODipine (NORVASC) 5 MG tablet Take 1 tablet (5 mg total) by mouth daily. 90 tablet 1 09/17/2014 at Unknown time  . apixaban (ELIQUIS) 2.5 MG TABS tablet Take 1 tablet (2.5 mg total) by mouth 2 (two) times daily. 180 tablet 1 09/17/2014 at Unknown time  . benazepril (LOTENSIN) 20 MG tablet Take 1 tablet (20 mg total) by mouth daily. 90 tablet 1 09/17/2014 at Unknown time  . furosemide (LASIX) 40 MG tablet Take 1 tablet (40 mg total) by mouth daily. 90 tablet 1 09/17/2014 at Unknown time  . hydrALAZINE (APRESOLINE) 25 MG tablet Take 1 tablet (25 mg total) by mouth 4 (four) times daily. 360 tablet 1 09/17/2014 at Unknown time  . potassium chloride SA (KLOR-CON M20) 20 MEQ tablet Take 1 tablet (20 mEq total) by mouth daily. 90 tablet 1 09/17/2014 at Unknown time  . Sennosides-Docusate Sodium (SENNA PLUS PO) Take by mouth as needed.  Past Month at Unknown time   Scheduled Meds: . amLODipine  5 mg Oral Daily  . apixaban  2.5 mg Oral BID  . benazepril  20 mg Oral Daily  . diphenhydrAMINE  12.5 mg Oral Once  . furosemide  40 mg Oral Daily  . hydrALAZINE  25 mg Oral QID  . potassium chloride SA  20 mEq Oral Daily  . senna-docusate  1 tablet Oral Once   Continuous Infusions:  PRN Meds:.acetaminophen **OR** acetaminophen, oxyCODONE, senna-docusate Assessment/Plan: Principal Problem:   Fracture of multiple pubic rami Active Problems:   Essential hypertension, benign   Atrial fibrillation   H/O: CVA (cerebrovascular accident)   CHF (congestive  heart failure)   Pelvic fracture   Fall  Non-Displaced Pelvic Fracture: Pain well controlled at rest. Exacerbated by movement, but relieved with oxycodone. Patient has been working well with PT who recommends SNF. Patient and daughter and agreeable to this. Patient is likely discharge to SNF today with follow up with Orthopedic Surgery in 6 weeks and PCP -Oxycodone 2.5 mg Q4H prn moderate pain -PT/OT -Up As Tolerated With Assistance  -Regular Diet -Follow up with Orthopedic Surgery in 6 weeks -Neurovascular checks -SNF  HTN: BP well controlled 120s/30-40s. Patient is on Amlodipine 5 mg daily, Benzazepril 20 mg daily, Lasix 40 mg daily, and Hydralazine 25 mg QID at home. We will continue her home medications on discharge.  -Amlodipine 5 mg daily -Benazepril 20 mg daily -Lasix 40 mg daily -Hydralazine 25 mg QID  Chronic Diastolic CHF: Patient is on 0.5 L O2 via [place] and satting 97% this morning, but she denies any shortness of breath. No lower extremity edema bilaterally. Patient is on Lasix 40 mg daily, Kdur 20 mEq daily and Benazepril 20 mg daily at home.  -Continue to monitor -Benazepril 20 mg daily -Lasix 40 mg daily  Atrial Fibrillation: Patient has a history of atrial fibrillation. Patient is on Eliquis 2.5 mg BID at home. Hemoglobin stable 13.7>13.2 this morning.  EKG appears to be Atrial Flutter with a 5:1 block.  -Continue Eliquis 2.5 mg BID   DVT/PE ppx: Eliquis 2.5 mg BID  Dispo: Disposition is deferred at this time, awaiting improvement of current medical problems.  Anticipated discharge in approximately 1-2 day(s).   The patient does have a current PCP Juanda Bond Altheimer, MD) and does not need an [facility] hospital follow-up appointment after discharge.  The patient does have transportation limitations that hinder transportation to clinic appointments.  .Services Needed at time of discharge: Y = Yes, Blank = No PT:   OT:   RN:   Equipment:   Other:     LOS: 2 days    Osa Craver, DO PGY-1 Internal Medicine Resident Pager # [phone] 09/19/2014 12:03 PM

## 2014-09-19 NOTE — Progress Notes (Signed)
Physical Therapy Treatment Patient Details Name: Debbie Spears MRN: [id number] DOB: March 15, 1912 Today's Date: 09/19/2014     History of Present Illness Debbie Spears is a 81 yo female with PMHx of HLD, HTN, Atrial Fibrillation, Diastolic CHF, OA, Osteoporosis, Vertigo and CVA who presents after a fall. Patient states this morning she was standing in her kitchen and turned and lost her balance. She fell backwards and landed on her right side and hit her head. She denies any loss of consciousness. Patient has a history of falls from imbalance.  Imaging shows non displaced sup/inf. pubic rami fractures on the right.    PT Comments    Progressing steadily, but expect pain to limit her somewhat.  Strength remains good. 09/19/2014     Follow Up Recommendations  SNF     Equipment Recommendations  None recommended by PT    Recommendations for Other Services       Precautions / Restrictions Precautions Precautions: Fall Restrictions Weight Bearing Restrictions: Yes RLE Weight Bearing: Weight bearing as tolerated LLE Weight Bearing: Weight bearing as tolerated    Mobility  Bed Mobility Overal bed mobility: Needs Assistance Bed Mobility: Supine to Sit     Supine to sit: Mod assist     General bed mobility comments: minimal assist to scoot to EOB and truncal assist.  Transfers Overall transfer level: Needs assistance Equipment used: Rolling walker (2 wheeled) Transfers: Sit to/from [facility] Sit to Stand: Min assist Stand pivot transfers: Min assist       General transfer comment: cues for hand placement  Ambulation/Gait         Gait velocity: deferred today due to increased pain in back       Stairs            Wheelchair Mobility    Modified Rankin (Stroke Patients Only)       Balance Overall balance assessment: No apparent balance deficits (not formally assessed) Sitting-balance support: No upper extremity supported;Single  extremity supported Sitting balance-Leahy Scale: Fair       Standing balance-Leahy Scale: Poor Standing balance comment: stands upright with use of RW                    Cognition Arousal/Alertness: Awake/alert Behavior During Therapy: WFL for tasks assessed/performed Overall Cognitive Status: Within Functional Limits for tasks assessed                      Exercises General Exercises - Lower Extremity Ankle Circles/Pumps: AROM;20 reps;Seated Quad Sets: AROM;Both;10 reps;Supine Gluteal Sets: AROM;Both;10 reps;Supine Heel Slides: AROM;10 reps;Both;Supine (resisted gross extension) Hip ABduction/ADduction: AROM;Strengthening;Both;10 reps;Supine    General Comments        Pertinent Vitals/Pain Pain Assessment: Faces Faces Pain Scale: Hurts even more Pain Location: back R and R pelvis Pain Descriptors / Indicators: Discomfort Pain Intervention(s): Limited activity within patient's tolerance    Home Living                      Prior Function            PT Goals (current goals can now be found in the care plan section) Acute Rehab PT Goals Patient Stated Goal: ultimately back home PT Goal Formulation: With patient Time For Goal Achievement: 09/30/2014 Potential to Achieve Goals: Good Progress towards PT goals: Progressing toward goals    Frequency  Min 3X/week    PT Plan Current plan remains appropriate  Co-evaluation             End of Session   Activity Tolerance: Patient limited by pain;Patient tolerated treatment well Patient left: in chair;with call bell/phone within reach;with family/visitor present     Time: 8921-1941 PT Time Calculation (min) (ACUTE ONLY): 32 min  Charges:  $Therapeutic Exercise: 8-22 mins $Therapeutic Activity: 8-22 mins                    G Codes:      Steel Kerney, Tessie Fass 09/19/2014, 10:29 AM  09/19/2014  Donnella Sham, PT [phone]  (pager)

## 2014-09-20 NOTE — Progress Notes (Signed)
Clinical Social Work Department CLINICAL SOCIAL WORK PLACEMENT NOTE 09/20/2014  Patient:  MEGHEN, AKOPYAN  Account Number:  [id number] Admit date:  09/17/2014  Clinical Social Worker:  Kemper Durie, [place]  Date/time:  09/18/2014 04:00 PM  Clinical Social Work is seeking post-discharge placement for this patient at the following level of care:   [place]   (*CSW will update this form in Epic as items are completed)   09/18/2014  Patient/family provided with [place] Department of Clinical Social Work's list of facilities offering this level of care within the geographic area requested by the patient (or if unable, by the patient's family).  09/18/2014  Patient/family informed of their freedom to choose among providers that offer the needed level of care, that participate in Medicare, Medicaid or managed care program needed by the patient, have an available bed and are willing to accept the patient.  09/18/2014  Patient/family informed of [facility]' ownership interest in [facility], as well as of the fact that they are under no obligation to receive care at this facility.  PASARR submitted to EDS on  PASARR number received on   FL2 transmitted to all facilities in geographic area requested by pt/family on  09/18/2014 FL2 transmitted to all facilities within larger geographic area on   Patient informed that his/her managed care company has contracts with or will negotiate with  certain facilities, including the following:     Patient/family informed of bed offers received:  09/19/2014 Patient chooses bed at [facility] Physician recommends and patient chooses bed at    Patient to be transferred to [place] on  09/20/2014 Patient to be transferred to facility by [facility] Patient and family notified of transfer on 09/20/2014 Name of family member notified:  Dtr: Corky Downs  The following physician request were entered in Epic:   Additional  Comments:

## 2014-09-20 NOTE — Progress Notes (Signed)
Subjective: No new complaints today. Slept well overnight.   D/c to SNF  Objective: Vital signs in last 24 hours: Filed Vitals:   09/19/14 1346 09/19/14 1814 09/19/14 2130 09/20/14 0621  BP: 133/48 126/51 115/50 149/38  Pulse: 63  48 58  Temp: 98.4 F (36.9 C)  98.2 F (36.8 C) 97.5 F (36.4 C)  TempSrc: Oral  Oral Oral  Resp: 18  20 20   Height:      Weight:    97 lb 14.2 oz (44.4 kg)  SpO2: 90%  94% 95%   Weight change: 2 lb 6.8 oz (1.1 kg)  Intake/Output Summary (Last 24 hours) at 09/20/14 1212 Last data filed at 09/20/14 0915  Gross per 24 hour  Intake    720 ml  Output    775 ml  Net    -55 ml   General: Vital signs reviewed. Patient is a thin appearing female, in no acute distress and cooperative with exam.  Neck: No carotid bruit present.  Cardiovascular: +S1 +S2 Pulmonary/Chest: Bibasilar crackles, no wheezing or rhonchi. Abdominal: Soft, non-tender, non-distended, BS + Musculoskeletal: Only mild tenderness over posterior right hip, no breaks in skin, no ecchymoses.  Extremities: No lower extremity edema bilaterally. Strong dorsalis pedis and posterior tibialis pulses bilaterally, normal sensation. Neurological: A&O x3 Skin: Warm, dry and intact. No rashes or erythema. Psychiatric: Normal mood and affect. speech and behavior is normal. Cognition and memory are normal.   Lab Results: Basic Metabolic Panel:  Recent Labs Lab 09/17/14 1351 09/18/14 0945  NA 145 142  K 4.0 3.9  CL 102 101  CO2 30 32  GLUCOSE 119* 106*  BUN 26* 27*  CREATININE 1.54* 1.49*  CALCIUM 9.3 8.9   CBC:  Recent Labs Lab 09/17/14 1351 09/18/14 0945  WBC 12.7* 8.6  NEUTROABS 11.0*  --   HGB 13.7 13.2  HCT 42.2 40.9  MCV 96.8 96.5  PLT 153 125*   Urinalysis:  Recent Labs Lab 09/17/14 1325  COLORURINE YELLOW  LABSPEC 1.009  PHURINE 7.0  GLUCOSEU NEGATIVE  HGBUR NEGATIVE  BILIRUBINUR NEGATIVE  KETONESUR NEGATIVE  PROTEINUR NEGATIVE  UROBILINOGEN 0.2    NITRITE NEGATIVE  LEUKOCYTESUR NEGATIVE   Medications:  I have reviewed the patient's current medications. Prior to Admission:  Prescriptions prior to admission  Medication Sig Dispense Refill Last Dose  . amLODipine (NORVASC) 5 MG tablet Take 1 tablet (5 mg total) by mouth daily. 90 tablet 1 09/17/2014 at Unknown time  . apixaban (ELIQUIS) 2.5 MG TABS tablet Take 1 tablet (2.5 mg total) by mouth 2 (two) times daily. 180 tablet 1 09/17/2014 at Unknown time  . benazepril (LOTENSIN) 20 MG tablet Take 1 tablet (20 mg total) by mouth daily. 90 tablet 1 09/17/2014 at Unknown time  . furosemide (LASIX) 40 MG tablet Take 1 tablet (40 mg total) by mouth daily. 90 tablet 1 09/17/2014 at Unknown time  . hydrALAZINE (APRESOLINE) 25 MG tablet Take 1 tablet (25 mg total) by mouth 4 (four) times daily. 360 tablet 1 09/17/2014 at Unknown time  . potassium chloride SA (KLOR-CON M20) 20 MEQ tablet Take 1 tablet (20 mEq total) by mouth daily. 90 tablet 1 09/17/2014 at Unknown time  . Sennosides-Docusate Sodium (SENNA PLUS PO) Take by mouth as needed.   Past Month at Unknown time   Scheduled Meds: . amLODipine  5 mg Oral Daily  . apixaban  2.5 mg Oral BID  . benazepril  20 mg Oral Daily  . diphenhydrAMINE  12.5 mg Oral Once  . furosemide  40 mg Oral Daily  . hydrALAZINE  25 mg Oral QID  . potassium chloride SA  20 mEq Oral Daily  . senna-docusate  1 tablet Oral Once   Continuous Infusions:  PRN Meds:.acetaminophen **OR** acetaminophen, oxyCODONE, senna-docusate   Assessment/Plan: 81 y/o F w/ PMHx of Atrial Flutter, admitted after fall w/ right pubic rami fracture.   Non-Displaced Pelvic Fracture: Pain well controlled at rest. Exacerbated by movement, but relieved with oxycodone. Discharge to SNF today. Follow up with Orthopedic Surgery in 6 weeks and PCP -Oxycodone 2.5 mg Q4H prn moderate pain -Up As Tolerated With Assistance  -Regular Diet -Follow up with Orthopedic Surgery in 6 weeks  HTN: BP well  controlled 120s/30-40s. Patient is on Amlodipine 5 mg daily, Benazepril 20 mg daily, Lasix 40 mg daily, and Hydralazine 25 mg QID at home. We will continue her home medications on discharge.  -Amlodipine 5 mg daily -Benazepril 20 mg daily -Lasix 40 mg daily -Hydralazine 25 mg QID  Chronic Diastolic CHF: Stable.  Patient is on Lasix 40 mg daily, Kdur 20 mEq daily and Benazepril 20 mg daily at home.  -Continue to monitor -Benazepril 20 mg daily -Lasix 40 mg daily  Atrial Fibrillation: Patient has a history of atrial fibrillation. Patient is on Eliquis 2.5 mg BID at home. EKG appears to be Atrial Flutter with a 5:1 block.  -Continue Eliquis 2.5 mg BID   DVT/PE ppx: Eliquis 2.5 mg BID  Dispo: Anticipated discharge to SNF today.  The patient does have a current PCP Juanda Bond Altheimer, MD) and does not need an [facility] hospital follow-up appointment after discharge.  The patient does have transportation limitations that hinder transportation to clinic appointments.  .Services Needed at time of discharge: Y = Yes, Blank = No PT:   OT:   RN:   Equipment:   Other:     LOS: 3 days   Signed: Luanne Bras, MD 09/20/2014 12:16 PM

## 2014-09-20 NOTE — Progress Notes (Signed)
DC via ambulance at this time. Daughter at bedside

## 2014-09-22 ENCOUNTER — Non-Acute Institutional Stay (SKILLED_NURSING_FACILITY): Payer: Medicare Other | Admitting: Internal Medicine

## 2014-09-22 DIAGNOSIS — I482 Chronic atrial fibrillation, unspecified: Secondary | ICD-10-CM

## 2014-09-22 DIAGNOSIS — S32592S Other specified fracture of left pubis, sequela: Principal | ICD-10-CM

## 2014-09-22 DIAGNOSIS — K5900 Constipation, unspecified: Secondary | ICD-10-CM

## 2014-09-22 DIAGNOSIS — I5033 Acute on chronic diastolic (congestive) heart failure: Secondary | ICD-10-CM

## 2014-09-22 DIAGNOSIS — S32502S Unspecified fracture of left pubis, sequela: Secondary | ICD-10-CM

## 2014-09-22 DIAGNOSIS — S32501S Unspecified fracture of right pubis, sequela: Secondary | ICD-10-CM

## 2014-09-22 DIAGNOSIS — I10 Essential (primary) hypertension: Secondary | ICD-10-CM

## 2014-09-22 DIAGNOSIS — S32591S Other specified fracture of right pubis, sequela: Secondary | ICD-10-CM

## 2014-09-22 NOTE — Progress Notes (Signed)
Patient ID: Debbie Spears, female   DOB: Sep 01, 1912, 81 y.o.   MRN: [id number]     Facility: [facility]    PCP: Limmie Patricia, MD  Code Status: DNR  Allergies  Allergen Reactions  . Olmesartan Medoxomil Diarrhea and Nausea And Vomiting    Chief Complaint  Patient presents with  . New Admit To SNF     HPI:  81 year old patient is here for short term rehabilitation post hospital admission from 09/17/14-09/20/14 post fall with multiple pubic rami non displaced fracture. Conservative management with pain medication was decided upon. She is here for physical and occupational therapy and goal is for her to return home. She is seen in her. She mentions that her pain is not under control at present. As per staff, she does not ask for pain medication. She feels tired. She has past medical history of HLD, HTN, Atrial Fibrillation, Diastolic CHF, OA, Osteoporosis and CVA.    Review of Systems:  Constitutional: Negative for fever, chills, diaphoresis.  HENT: Negative for headache, congestion Eyes: Negative for eye pain, blurred vision, double vision and discharge.  Respiratory: Negative for cough, shortness of breath and wheezing.   Cardiovascular: Negative for chest pain, palpitations, leg swelling.  Gastrointestinal: Negative for heartburn, nausea, vomiting, abdominal pain Genitourinary: Negative for dysuria Musculoskeletal: Negative for back pain, falls. Skin: Negative for itching, rash.  Neurological: Negative for dizziness, tingling, focal weakness Psychiatric/Behavioral: Negative for depression.    Past Medical History  Diagnosis Date  . Hypertension   . Hyperlipidemia   . Glucose intolerance (impaired glucose tolerance)   . Osteoporosis   . Atrial fibrillation   . Diastolic CHF, acute   . Stroke 2014    "mini stroke"  . Osteoarthritis     "back mainly" (09/17/2014)  . Pelvic fracture 09/17/2014   Past Surgical History  Procedure  Laterality Date  . Laparoscopic cholecystectomy  March 02, 2007  . Cataract extraction w/ intraocular lens  implant, bilateral Bilateral 2000's   Social History:   reports that she has never smoked. She has never used smokeless tobacco. She reports that she does not drink alcohol or use illicit drugs.  Family History  Problem Relation Age of Onset  . Stroke Mother   . Heart attack Brother     Medications: Patient's Medications  New Prescriptions   No medications on file  Previous Medications   ACETAMINOPHEN (TYLENOL) 325 MG TABLET    Take 2 tablets (650 mg total) by mouth every 6 (six) hours as needed for mild pain (or Fever >/= 101).   AMLODIPINE (NORVASC) 5 MG TABLET    Take 1 tablet (5 mg total) by mouth daily.   APIXABAN (ELIQUIS) 2.5 MG TABS TABLET    Take 1 tablet (2.5 mg total) by mouth 2 (two) times daily.   BENAZEPRIL (LOTENSIN) 20 MG TABLET    Take 1 tablet (20 mg total) by mouth daily.   FUROSEMIDE (LASIX) 40 MG TABLET    Take 1 tablet (40 mg total) by mouth daily.   HYDRALAZINE (APRESOLINE) 25 MG TABLET    Take 1 tablet (25 mg total) by mouth 4 (four) times daily.   OXYCODONE (OXY IR/ROXICODONE) 5 MG IMMEDIATE RELEASE TABLET    Take 0.5 tablets (2.5 mg total) by mouth every 4 (four) hours as needed for moderate pain.   POTASSIUM CHLORIDE SA (KLOR-CON M20) 20 MEQ TABLET    Take 1 tablet (20 mEq total) by mouth daily.  SENNOSIDES-DOCUSATE SODIUM (SENNA PLUS PO)    Take by mouth as needed.  Modified Medications   No medications on file  Discontinued Medications   No medications on file     Physical Exam: Filed Vitals:   09/22/14 1418  BP: 150/75  Pulse: 56  Temp: 97.3 F (36.3 C)  Resp: 16  SpO2: 98%    General- elderly female in no acute distress Head- normocephalic, atraumatic Throat- moist mucus membrane Eyes- no pallor, no icterus, no discharge Neck- no cervical lymphadenopathy Cardiovascular- normal s1,s2, no murmurs, palpable dorsalis pedis, no leg  edema Respiratory- bilateral clear to auscultation, no wheeze, no rhonchi, no crackles, no use of accessory muscles Abdomen- bowel sounds present, soft, non tender Musculoskeletal- able to move all 4 extremities, on a recliner Neurological- no focal deficit Skin- warm and dry Psychiatry- alert and oriented    Labs reviewed: Basic Metabolic Panel:  Recent Labs  09/17/14 1351 09/18/14 0945  NA 145 142  K 4.0 3.9  CL 102 101  CO2 30 32  GLUCOSE 119* 106*  BUN 26* 27*  CREATININE 1.54* 1.49*  CALCIUM 9.3 8.9   Liver Function Tests: No results for input(s): AST, ALT, ALKPHOS, BILITOT, PROT, ALBUMIN in the last 8760 hours. No results for input(s): LIPASE, AMYLASE in the last 8760 hours. No results for input(s): AMMONIA in the last 8760 hours. CBC:  Recent Labs  09/17/14 1351 09/18/14 0945  WBC 12.7* 8.6  NEUTROABS 11.0*  --   HGB 13.7 13.2  HCT 42.2 40.9  MCV 96.8 96.5  PLT 153 125*    Assessment/Plan  Pubic rami fracture No surgical intervention performed. Pain is not under control. On oxyIR 2.5 mg q4h prn at present. Change this to 5 mg po bid with current q4h prn for breakthrough pain and reassess. Will have patient work with PT/OT as tolerated to regain strength and restore function.  Fall precautions are in place.She has follow up with Dr Erlinda Hong from orthopedics onn 10/31/2014. WBAT  HTN Elevated bp in facility on admission. Her pain could be contributing some. Will have her bp checked daily and if > 3 readings > 140/90, will increase her amlodipine dosing. Continue amlodipine 5 mg daily, benazepril 20 mg daily and hydralazine 25 mg qid for now.  Chronic Diastolic CHF euvolemic at present. continue Lasix 40 mg daily, Kdur 20 mEq daily and Benazepril 20 mg daily. Check bmp in 1 week  Atrial Fibrillation Rate currently controlled. Continue Eliquis 2.5 mg BID.   Constipation On prn senna s, change this to senna s 1 tab daily for now and reassess  Goals of care:  short term rehabilitation   Labs/tests ordered: cbc, bmp in 1 week  Family/ staff Communication: reviewed care plan with patient and nursing supervisor    Blanchie Serve, MD  [place] [phone] (Monday-Friday 8 am - 5 pm) [phone] (afterhours)

## 2014-09-26 ENCOUNTER — Other Ambulatory Visit: Payer: Self-pay | Admitting: *Deleted

## 2014-09-26 MED ORDER — OXYCODONE HCL 5 MG PO TABS: ORAL_TABLET | ORAL | Status: AC

## 2014-09-26 NOTE — Telephone Encounter (Signed)
[facility] 

## 2014-09-30 ENCOUNTER — Non-Acute Institutional Stay (SKILLED_NURSING_FACILITY): Payer: Medicare Other | Admitting: Registered Nurse

## 2014-09-30 DIAGNOSIS — J069 Acute upper respiratory infection, unspecified: Secondary | ICD-10-CM

## 2014-10-01 ENCOUNTER — Encounter: Payer: Self-pay | Admitting: Registered Nurse

## 2014-10-01 NOTE — Progress Notes (Signed)
Patient ID: Debbie Spears, female   DOB: Jul 01, 1912, 81 y.o.   MRN: [id number]   Place of Service: [facility]  Allergies  Allergen Reactions  . Olmesartan Medoxomil Diarrhea and Nausea And Vomiting    Code Status: DNR  Goals of Care: Comfort and Quality of Life/STR  Chief Complaint  Patient presents with  . Acute Visit    cough/congestion    HPI  81 y.o. female with PMH of HTN, diastolic CHF, HLD, atrial fibrillation, osteoporosis, multiple pubic rami non displaced fracture among others is being seen for an acute visit at the request of daughter for the evaluation of cough and congestion x 3 days. Seen in room today. Daughter at bedside. Per daughter, the patient has been coughing up thick yellow mucus periodically throughout the day and her appetite has decreased since onset of symptoms. She has had some robitussin w/o any relief.  Recent CXR negative. Patient reported having productive cough, poor appetite, and shortness of breath. She denies any fever, chills, headache, earache, dizziness, sore throat, wheezing, chest pain, or nausea/vomiting. ROS otherwise unremarkable.     Past Medical History  Diagnosis Date  . Hypertension   . Hyperlipidemia   . Glucose intolerance (impaired glucose tolerance)   . Osteoporosis   . Atrial fibrillation   . Diastolic CHF, acute   . Stroke 2014    "mini stroke"  . Osteoarthritis     "back mainly" (09/17/2014)  . Pelvic fracture 09/17/2014    Past Surgical History  Procedure Laterality Date  . Laparoscopic cholecystectomy  March 02, 2007  . Cataract extraction w/ intraocular lens  implant, bilateral Bilateral 2000's    History   Social History  . Marital Status: Widowed    Spouse Name: N/A    Number of Children: N/A  . Years of Education: N/A   Occupational History  . Not on file.   Social History Main Topics  . Smoking status: Never Smoker   . Smokeless tobacco: Never Used  . Alcohol Use: No  . Drug Use: No  .  Sexual Activity: No   Other Topics Concern  . Not on file   Social History Narrative   Widow and lives alone. Daughter Remo Lipps and others call or check on her daily.       Medication List       This list is accurate as of: 09/30/14 11:59 PM.  Always use your most recent med list.               acetaminophen 325 MG tablet  Commonly known as:  TYLENOL  Take 2 tablets (650 mg total) by mouth every 6 (six) hours as needed for mild pain (or Fever >/= 101).     amLODipine 5 MG tablet  Commonly known as:  NORVASC  Take 1 tablet (5 mg total) by mouth daily.     apixaban 2.5 MG Tabs tablet  Commonly known as:  ELIQUIS  Take 1 tablet (2.5 mg total) by mouth 2 (two) times daily.     benazepril 20 MG tablet  Commonly known as:  LOTENSIN  Take 1 tablet (20 mg total) by mouth daily.     furosemide 40 MG tablet  Commonly known as:  LASIX  Take 1 tablet (40 mg total) by mouth daily.     hydrALAZINE 25 MG tablet  Commonly known as:  APRESOLINE  Take 1 tablet (25 mg total) by mouth 4 (four) times daily.     oxyCODONE  5 MG immediate release tablet  Commonly known as:  Oxy IR/ROXICODONE  Take one tablet by mouth every six hours for pain     potassium chloride SA 20 MEQ tablet  Commonly known as:  KLOR-CON M20  Take 1 tablet (20 mEq total) by mouth daily.     SENNA PLUS PO  Take by mouth as needed.        Physical Exam  BP 120/66 mmHg  Pulse 70  Temp(Src) 98.3 F (36.8 C)  Resp 18  Constitutional: thin elderly female in no acute distress. Ill-appearing.  HEENT: Normocephalic and atraumatic. PERRL. EOM intact. No icterus. External auditory canals patent, auricles without lesions. No nasal discharge. Maxillary sinus tender to palpation. Oral mucosa moist. Posterior pharynx erythematous but clear of any exudate or lesions.  Neck: Supple and nontender. Bilateral anterior cervical lymphadenopathy, tender with palpation. No masses or or thyromegaly. No JVD or carotid  bruits. Cardiac: Normal S1, S2. RRR without appreciable murmurs, rubs, or gallops. Distal pulses intact. No dependent edema.  Lungs: No respiratory distress. Breath sounds coarse bilaterally with rhonchi throughout lung fields.  Abdomen: Audible bowel sounds in all quadrants. Soft, nontender, nondistended.  Musculoskeletal: able to move all extremities. Generalized weakness present Skin: Warm and dry. No rash noted.  Neurological: Alert and oriented to self Psychiatric: Appropriate mood and affect.   Labs Reviewed  CBC Latest Ref Rng 09/18/2014 09/17/2014 04/26/2013  WBC 4.0 - 10.5 K/uL 8.6 12.7(H) 5.6  Hemoglobin 12.0 - 15.0 g/dL 13.2 13.7 12.9  Hematocrit 36.0 - 46.0 % 40.9 42.2 40.0  Platelets 150 - 400 K/uL 125(L) 153 141(L)     CMP Latest Ref Rng 09/18/2014 09/17/2014 04/26/2013  Glucose 70 - 99 mg/dL 106(H) 119(H) 88  BUN 6 - 23 mg/dL 27(H) 26(H) 26(H)  Creatinine 0.50 - 1.10 mg/dL 1.49(H) 1.54(H) 1.45(H)  Sodium 135 - 145 mmol/L 142 145 143  Potassium 3.5 - 5.1 mmol/L 3.9 4.0 4.2  Chloride 96 - 112 mEq/L 101 102 103  CO2 19 - 32 mmol/L 32 30 30  Calcium 8.4 - 10.5 mg/dL 8.9 9.3 9.2  Total Protein 6.0 - 8.3 g/dL - - -  Total Bilirubin 0.3 - 1.2 mg/dL - - -  Alkaline Phos 39 - 117 U/L - - -  AST 0 - 37 U/L - - -  ALT 0 - 35 U/L - - -    Assessment & Plan 1. Upper respiratory infection with cough and congestion CXR negative. Start Robitussin DM 63mL every six hours x 3 days then every six hours as needed and Duoneb via nebulizer every six hours x 3 days then every six hours as needed for shortness of breath. Encourage hydration. Explain to daughter that symptoms most likely viral in nature. If symptoms worsen over the next several days, may consider antibiotic therapy. Continue to monitor for now.    Diagnostic Studies/Labs Ordered: cbc with diff.    Family/Staff Communication Plan of care discussed with resident, daughter, and nursing staff. Resident, daughter, and nursing  staff verbalized understanding and agree with plan of care. No additional questions or concerns reported.    Arthur Holms, MSN, AGNP-C [facility] [address] [place], [place] [address] [phone] [8am-5pm] After hours: [phone]

## 2014-10-02 ENCOUNTER — Encounter (HOSPITAL_COMMUNITY): Payer: Self-pay | Admitting: *Deleted

## 2014-10-02 ENCOUNTER — Inpatient Hospital Stay (HOSPITAL_COMMUNITY)
Admission: EM | Admit: 2014-10-02 | Discharge: 2014-10-13 | DRG: 193 | Disposition: E | Payer: Medicare Other | Attending: Internal Medicine | Admitting: Internal Medicine

## 2014-10-02 ENCOUNTER — Emergency Department (HOSPITAL_COMMUNITY): Payer: Medicare Other

## 2014-10-02 DIAGNOSIS — E875 Hyperkalemia: Secondary | ICD-10-CM

## 2014-10-02 DIAGNOSIS — E785 Hyperlipidemia, unspecified: Secondary | ICD-10-CM | POA: Diagnosis present

## 2014-10-02 DIAGNOSIS — Y95 Nosocomial condition: Secondary | ICD-10-CM | POA: Diagnosis present

## 2014-10-02 DIAGNOSIS — N184 Chronic kidney disease, stage 4 (severe): Secondary | ICD-10-CM | POA: Diagnosis present

## 2014-10-02 DIAGNOSIS — J189 Pneumonia, unspecified organism: Secondary | ICD-10-CM | POA: Diagnosis not present

## 2014-10-02 DIAGNOSIS — R0602 Shortness of breath: Secondary | ICD-10-CM

## 2014-10-02 DIAGNOSIS — Z66 Do not resuscitate: Secondary | ICD-10-CM | POA: Diagnosis present

## 2014-10-02 DIAGNOSIS — M1990 Unspecified osteoarthritis, unspecified site: Secondary | ICD-10-CM | POA: Diagnosis present

## 2014-10-02 DIAGNOSIS — N179 Acute kidney failure, unspecified: Secondary | ICD-10-CM

## 2014-10-02 DIAGNOSIS — N289 Disorder of kidney and ureter, unspecified: Secondary | ICD-10-CM

## 2014-10-02 DIAGNOSIS — Z515 Encounter for palliative care: Secondary | ICD-10-CM

## 2014-10-02 DIAGNOSIS — N170 Acute kidney failure with tubular necrosis: Secondary | ICD-10-CM | POA: Diagnosis present

## 2014-10-02 DIAGNOSIS — E860 Dehydration: Secondary | ICD-10-CM | POA: Diagnosis present

## 2014-10-02 DIAGNOSIS — J181 Lobar pneumonia, unspecified organism: Secondary | ICD-10-CM

## 2014-10-02 DIAGNOSIS — R7309 Other abnormal glucose: Secondary | ICD-10-CM

## 2014-10-02 DIAGNOSIS — J9600 Acute respiratory failure, unspecified whether with hypoxia or hypercapnia: Secondary | ICD-10-CM | POA: Diagnosis not present

## 2014-10-02 DIAGNOSIS — I5032 Chronic diastolic (congestive) heart failure: Secondary | ICD-10-CM | POA: Diagnosis present

## 2014-10-02 DIAGNOSIS — I4891 Unspecified atrial fibrillation: Secondary | ICD-10-CM | POA: Diagnosis present

## 2014-10-02 DIAGNOSIS — M810 Age-related osteoporosis without current pathological fracture: Secondary | ICD-10-CM | POA: Diagnosis present

## 2014-10-02 DIAGNOSIS — I482 Chronic atrial fibrillation: Secondary | ICD-10-CM | POA: Diagnosis present

## 2014-10-02 DIAGNOSIS — I129 Hypertensive chronic kidney disease with stage 1 through stage 4 chronic kidney disease, or unspecified chronic kidney disease: Secondary | ICD-10-CM | POA: Diagnosis present

## 2014-10-02 DIAGNOSIS — R579 Shock, unspecified: Secondary | ICD-10-CM | POA: Diagnosis not present

## 2014-10-02 DIAGNOSIS — Z7901 Long term (current) use of anticoagulants: Secondary | ICD-10-CM

## 2014-10-02 DIAGNOSIS — Z8673 Personal history of transient ischemic attack (TIA), and cerebral infarction without residual deficits: Secondary | ICD-10-CM

## 2014-10-02 DIAGNOSIS — R140 Abdominal distension (gaseous): Secondary | ICD-10-CM

## 2014-10-02 LAB — CBC WITH DIFFERENTIAL/PLATELET
Basophils Absolute: 0 10*3/uL (ref 0.0–0.1)
Basophils Relative: 0 % (ref 0–1)
EOS ABS: 0 10*3/uL (ref 0.0–0.7)
Eosinophils Relative: 0 % (ref 0–5)
HEMATOCRIT: 36.2 % (ref 36.0–46.0)
Hemoglobin: 11.4 g/dL — ABNORMAL LOW (ref 12.0–15.0)
Lymphocytes Relative: 6 % — ABNORMAL LOW (ref 12–46)
Lymphs Abs: 0.6 10*3/uL — ABNORMAL LOW (ref 0.7–4.0)
MCH: 32.2 pg (ref 26.0–34.0)
MCHC: 31.5 g/dL (ref 30.0–36.0)
MCV: 102.3 fL — ABNORMAL HIGH (ref 78.0–100.0)
Monocytes Absolute: 0.8 10*3/uL (ref 0.1–1.0)
Monocytes Relative: 9 % (ref 3–12)
Neutro Abs: 7.5 10*3/uL (ref 1.7–7.7)
Neutrophils Relative %: 85 % — ABNORMAL HIGH (ref 43–77)
PLATELETS: 251 10*3/uL (ref 150–400)
RBC: 3.54 MIL/uL — AB (ref 3.87–5.11)
RDW: 14.9 % (ref 11.5–15.5)
WBC: 8.8 10*3/uL (ref 4.0–10.5)

## 2014-10-02 MED ORDER — VANCOMYCIN HCL IN DEXTROSE 1-5 GM/200ML-% IV SOLN
1000.0000 mg | Freq: Once | INTRAVENOUS | Status: DC
  Filled 2014-10-02: qty 200

## 2014-10-02 MED ORDER — PIPERACILLIN-TAZOBACTAM 3.375 G IVPB 30 MIN
3.3750 g | Freq: Once | INTRAVENOUS | Status: AC
  Administered 2014-10-02: 3.375 g via INTRAVENOUS
  Filled 2014-10-02: qty 50

## 2014-10-02 MED ORDER — SODIUM CHLORIDE 0.9 % IV SOLN: INTRAVENOUS | Status: DC

## 2014-10-02 NOTE — ED Provider Notes (Signed)
CSN: [id number]     Arrival date & time 10/12/2014  2227 History   This chart was scribed for Julianne Rice, MD by Chester Holstein, ED Scribe. This patient was seen in room A11C/A11C and the patient's care was started at 11:11 PM.     Chief Complaint  Patient presents with  . Shortness of Breath  . Cough    Patient is a 81 y.o. female presenting with shortness of breath and cough. The history is provided by the patient and a relative. No language interpreter was used.  Shortness of Breath Associated symptoms: cough   Associated symptoms: no abdominal pain, no chest pain, no fever, no headaches, no rash and no vomiting   Cough Associated symptoms: shortness of breath   Associated symptoms: no chest pain, no fever, no headaches and no rash     HPI Comments: HPI Comments: Debbie Spears is a 81 y.o. female brought in by ambulance, with PMHx of HTN, HLD, osteoporosis, osteoarthritis, Afib, and diastolic CHF, who presents to the Emergency Department complaining of cough with onset 5 days ago. Pt notes associated congestion, SOB and trouble swallowing. Relative states pt has been staying at a nursing home for 12 days during her rehab. Debbie Spears states pt has not been febrile. Pt is not on oxygen at home. Patient was started on Augmentin for presumed pneumonia. Pt denies chest pain and abdominal pain. Pt's PCP is Dr. Elyse Hsu.   Past Medical History  Diagnosis Date  . Hypertension   . Hyperlipidemia   . Glucose intolerance (impaired glucose tolerance)   . Osteoporosis   . Atrial fibrillation   . Diastolic CHF, acute   . Stroke 2014    "mini stroke"  . Osteoarthritis     "back mainly" (09/17/2014)  . Pelvic fracture 09/17/2014   Past Surgical History  Procedure Laterality Date  . Laparoscopic cholecystectomy  March 02, 2007  . Cataract extraction w/ intraocular lens  implant, bilateral Bilateral 2000's   Family History  Problem Relation Age of Onset  . Stroke Mother   . Heart attack  Brother    History  Substance Use Topics  . Smoking status: Never Smoker   . Smokeless tobacco: Never Used  . Alcohol Use: No   OB History    No data available     Review of Systems  Constitutional: Positive for fatigue. Negative for fever.  HENT: Positive for congestion and trouble swallowing.   Respiratory: Positive for cough and shortness of breath.   Cardiovascular: Negative for chest pain.  Gastrointestinal: Negative for nausea, vomiting and abdominal pain.  Skin: Negative for rash and wound.  Neurological: Negative for dizziness, weakness, light-headedness, numbness and headaches.  All other systems reviewed and are negative.     Allergies  Olmesartan medoxomil  Home Medications   Prior to Admission medications   Medication Sig Start Date End Date Taking? Authorizing Provider  acetaminophen (TYLENOL) 325 MG tablet Take 2 tablets (650 mg total) by mouth every 6 (six) hours as needed for mild pain (or Fever >/= 101). 09/19/14  Yes Alexa Marvel Plan, MD  amLODipine (NORVASC) 5 MG tablet Take 1 tablet (5 mg total) by mouth daily. 08/08/14  Yes Fay Records, MD  amoxicillin-clavulanate (AUGMENTIN) 500-125 MG per tablet Take 1 tablet by mouth every 12 (twelve) hours. For 7 days   Yes Historical Provider, MD  apixaban (ELIQUIS) 2.5 MG TABS tablet Take 1 tablet (2.5 mg total) by mouth 2 (two) times daily. 08/08/14  Yes Dorris Carnes  V, MD  benazepril (LOTENSIN) 20 MG tablet Take 1 tablet (20 mg total) by mouth daily. 08/08/14  Yes Fay Records, MD  furosemide (LASIX) 40 MG tablet Take 1 tablet (40 mg total) by mouth daily. 08/08/14  Yes Fay Records, MD  guaiFENesin-dextromethorphan (ROBITUSSIN DM) 100-10 MG/5ML syrup Take 10 mLs by mouth every 6 (six) hours as needed for cough.   Yes Historical Provider, MD  hydrALAZINE (APRESOLINE) 25 MG tablet Take 1 tablet (25 mg total) by mouth 4 (four) times daily. 08/08/14  Yes Fay Records, MD  ipratropium-albuterol (DUONEB) 0.5-2.5 (3) MG/3ML  SOLN Take 3 mLs by nebulization every 6 (six) hours as needed.   Yes Historical Provider, MD  oxyCODONE (OXY IR/ROXICODONE) 5 MG immediate release tablet Take one tablet by mouth every six hours for pain Patient taking differently: Take 5 mg by mouth 3 (three) times daily.  09/26/14  Yes [employer], DO  potassium chloride SA (KLOR-CON M20) 20 MEQ tablet Take 1 tablet (20 mEq total) by mouth daily. 08/08/14  Yes Fay Records, MD  Sennosides-Docusate Sodium (SENNA PLUS PO) Take 1 tablet by mouth 2 (two) times daily.    Yes Historical Provider, MD   BP 114/42 mmHg  Pulse 67  Temp(Src) 98.2 F (36.8 C) (Oral)  Resp 18  Ht 5' (1.524 m)  Wt 102 lb 6.4 oz (46.448 kg)  BMI 20.00 kg/m2  SpO2 93% Physical Exam  Constitutional: Debbie Spears is oriented to person, place, and time. Debbie Spears appears well-developed and well-nourished. No distress.  HENT:  Head: Normocephalic and atraumatic.  Mouth/Throat: Oropharynx is clear and moist.  Eyes: Conjunctivae and EOM are normal. Pupils are equal, round, and reactive to light.  Neck: Normal range of motion. Neck supple.  Cardiovascular: Normal rate and regular rhythm.   Pulmonary/Chest: Effort normal and breath sounds normal. No respiratory distress. Debbie Spears has no wheezes. Debbie Spears has no rales. Debbie Spears exhibits no tenderness.  Rhonchi throughout. Decreased breath sounds right base. Scattered mild expiratory wheezing  Abdominal: Soft. Bowel sounds are normal. Debbie Spears exhibits no distension. There is no tenderness. There is no rebound and no guarding.  Musculoskeletal: Normal range of motion. Debbie Spears exhibits no edema or tenderness.  Neurological: Debbie Spears is alert and oriented to person, place, and time.  Skin: Skin is warm and dry. No rash noted. No erythema.  Psychiatric: Debbie Spears has a normal mood and affect. Her behavior is normal.  Nursing note and vitals reviewed.   ED Course  Procedures (including critical care time) DIAGNOSTIC STUDIES: Oxygen Saturation is 96% on on  oxygen, normal by my interpretation.    COORDINATION OF CARE: 11:15 PM Discussed treatment plan with patient at beside, the patient agrees with the plan and has no further questions at this time.   Labs Review Labs Reviewed  CBC WITH DIFFERENTIAL - Abnormal; Notable for the following:    RBC 3.54 (*)    Hemoglobin 11.4 (*)    MCV 102.3 (*)    Neutrophils Relative % 85 (*)    Lymphocytes Relative 6 (*)    Lymphs Abs 0.6 (*)    All other components within normal limits  COMPREHENSIVE METABOLIC PANEL - Abnormal; Notable for the following:    Potassium 6.2 (*)    CO2 34 (*)    Glucose, Bld 164 (*)    BUN 72 (*)    Creatinine, Ser 2.42 (*)    Albumin 3.1 (*)    Alkaline Phosphatase 150 (*)    GFR calc  non Af Amer 15 (*)    GFR calc Af Amer 18 (*)    All other components within normal limits  BRAIN NATRIURETIC PEPTIDE - Abnormal; Notable for the following:    B Natriuretic Peptide 268.4 (*)    All other components within normal limits  POTASSIUM - Abnormal; Notable for the following:    Potassium 6.2 (*)    All other components within normal limits  CBG MONITORING, ED - Abnormal; Notable for the following:    Glucose-Capillary 261 (*)    All other components within normal limits  CULTURE, BLOOD (ROUTINE X 2)  CULTURE, BLOOD (ROUTINE X 2)  URINE CULTURE  CULTURE, BLOOD (ROUTINE X 2)  CULTURE, BLOOD (ROUTINE X 2)  CULTURE, EXPECTORATED SPUTUM-ASSESSMENT  GRAM STAIN  MRSA PCR SCREENING  TROPONIN I  LACTIC ACID, PLASMA  URINALYSIS, ROUTINE W REFLEX MICROSCOPIC  LEGIONELLA ANTIGEN, URINE  STREP PNEUMONIAE URINARY ANTIGEN  TSH  HEMOGLOBIN A1C  INFLUENZA PANEL BY PCR (TYPE A & B, H1N1)  I-STAT CG4 LACTIC ACID, ED    Imaging Review Dg Chest Port 1 View  09/29/2014   CLINICAL DATA:  SOB and cough X a few daysHx Afib and HTN, controlled  EXAM: PORTABLE CHEST - 1 VIEW  COMPARISON:  09/17/2014  FINDINGS: New opacity at the right lung base obscures the right heart border and  right hemidiaphragm. This is consistent with a combination of pneumonia and pleural fluid.  Remainder the lungs show chronic interstitial prominence but no areas of consolidation. No left pleural effusion. No pneumothorax.  Cardiac silhouette is mildly enlarged. No gross mediastinal or hilar masses.  Bony thorax is diffusely demineralized.  IMPRESSION: New right lower lung zone opacity consistent with a combination of pneumonia and small to moderate pleural effusion.   Electronically Signed   By: Lajean Manes M.D.   On: 09/20/2014 22:53     EKG Interpretation None      MDM   Final diagnoses:  HCAP (healthcare-associated pneumonia)  Renal insufficiency    I personally performed the services described in this documentation, which was scribed in my presence. The recorded information has been reviewed and is accurate.   Patient with elevated creatinine and potassium. Given insulin and D50 in the emergency department. Also given calcium gluconate. X-ray shows right lower lobe pneumonia. Started on broad-spectrum coverage for hospital-acquired pneumonia. Normal lactic acid. Vital signs remained stable. Does not appear to be septic. Discussed with hospitalist and will admit.   Julianne Rice, MD 10/03/14 [phone]

## 2014-10-02 NOTE — ED Notes (Signed)
Pt placed into gown and on monitor upon arrival to room. Pt monitored by blood pressure, pulse ox, and 12 lead. Pts EKG given to and signed by Dr. Zenia Resides.

## 2014-10-02 NOTE — ED Notes (Signed)
Pt arrives via EMS from [facility]. Per EMS pt has not been feeling well since Sunday. Pt has been c/o SOB and a congested cough with the inability to cough any secretions up. Upon arrival EMS got an SPO2 of 91% on 4L, Rhonchi throughout all lung fields, Wheezing upper fields and diminished in the bases. EMS gave 5mg  albuterol and 43mcg of atrovent. Pt has been lethargic over the last few days. Denies pain at this time.

## 2014-10-03 ENCOUNTER — Encounter (HOSPITAL_COMMUNITY): Payer: Self-pay | Admitting: *Deleted

## 2014-10-03 ENCOUNTER — Observation Stay (HOSPITAL_COMMUNITY): Payer: Medicare Other

## 2014-10-03 DIAGNOSIS — J189 Pneumonia, unspecified organism: Secondary | ICD-10-CM | POA: Diagnosis present

## 2014-10-03 DIAGNOSIS — N179 Acute kidney failure, unspecified: Secondary | ICD-10-CM

## 2014-10-03 DIAGNOSIS — Y95 Nosocomial condition: Secondary | ICD-10-CM | POA: Diagnosis present

## 2014-10-03 DIAGNOSIS — E785 Hyperlipidemia, unspecified: Secondary | ICD-10-CM | POA: Diagnosis present

## 2014-10-03 DIAGNOSIS — J9600 Acute respiratory failure, unspecified whether with hypoxia or hypercapnia: Secondary | ICD-10-CM | POA: Diagnosis not present

## 2014-10-03 DIAGNOSIS — M1990 Unspecified osteoarthritis, unspecified site: Secondary | ICD-10-CM | POA: Diagnosis present

## 2014-10-03 DIAGNOSIS — R7309 Other abnormal glucose: Secondary | ICD-10-CM

## 2014-10-03 DIAGNOSIS — Z66 Do not resuscitate: Secondary | ICD-10-CM | POA: Diagnosis present

## 2014-10-03 DIAGNOSIS — I5032 Chronic diastolic (congestive) heart failure: Secondary | ICD-10-CM | POA: Diagnosis present

## 2014-10-03 DIAGNOSIS — I129 Hypertensive chronic kidney disease with stage 1 through stage 4 chronic kidney disease, or unspecified chronic kidney disease: Secondary | ICD-10-CM | POA: Diagnosis present

## 2014-10-03 DIAGNOSIS — I10 Essential (primary) hypertension: Secondary | ICD-10-CM

## 2014-10-03 DIAGNOSIS — R0602 Shortness of breath: Secondary | ICD-10-CM | POA: Diagnosis present

## 2014-10-03 DIAGNOSIS — I4891 Unspecified atrial fibrillation: Secondary | ICD-10-CM

## 2014-10-03 DIAGNOSIS — M810 Age-related osteoporosis without current pathological fracture: Secondary | ICD-10-CM | POA: Diagnosis present

## 2014-10-03 DIAGNOSIS — Z7901 Long term (current) use of anticoagulants: Secondary | ICD-10-CM | POA: Diagnosis not present

## 2014-10-03 DIAGNOSIS — N170 Acute kidney failure with tubular necrosis: Secondary | ICD-10-CM | POA: Diagnosis present

## 2014-10-03 DIAGNOSIS — I482 Chronic atrial fibrillation: Secondary | ICD-10-CM

## 2014-10-03 DIAGNOSIS — J181 Lobar pneumonia, unspecified organism: Secondary | ICD-10-CM

## 2014-10-03 DIAGNOSIS — E875 Hyperkalemia: Secondary | ICD-10-CM | POA: Diagnosis present

## 2014-10-03 DIAGNOSIS — N184 Chronic kidney disease, stage 4 (severe): Secondary | ICD-10-CM | POA: Diagnosis present

## 2014-10-03 DIAGNOSIS — Z8673 Personal history of transient ischemic attack (TIA), and cerebral infarction without residual deficits: Secondary | ICD-10-CM | POA: Diagnosis not present

## 2014-10-03 DIAGNOSIS — R579 Shock, unspecified: Secondary | ICD-10-CM | POA: Diagnosis not present

## 2014-10-03 DIAGNOSIS — E860 Dehydration: Secondary | ICD-10-CM | POA: Diagnosis present

## 2014-10-03 DIAGNOSIS — Z515 Encounter for palliative care: Secondary | ICD-10-CM | POA: Diagnosis not present

## 2014-10-03 LAB — BASIC METABOLIC PANEL
Anion gap: 8 (ref 5–15)
Anion gap: 11 (ref 5–15)
Anion gap: 11 (ref 5–15)
BUN: 82 mg/dL — ABNORMAL HIGH (ref 6–23)
BUN: 75 mg/dL — ABNORMAL HIGH (ref 6–23)
BUN: 78 mg/dL — ABNORMAL HIGH (ref 6–23)
CALCIUM: 8.9 mg/dL (ref 8.4–10.5)
CALCIUM: 8.7 mg/dL (ref 8.4–10.5)
CHLORIDE: 104 mmol/L (ref 96–112)
CO2: 31 mmol/L (ref 19–32)
CO2: 22 mmol/L (ref 19–32)
CO2: 28 mmol/L (ref 19–32)
CREATININE: 2.67 mg/dL — AB (ref 0.50–1.10)
CREATININE: 2.65 mg/dL — AB (ref 0.50–1.10)
Calcium: 9.8 mg/dL (ref 8.4–10.5)
Chloride: 102 mEq/L (ref 96–112)
Chloride: 101 mmol/L (ref 96–112)
Creatinine, Ser: 2.92 mg/dL — ABNORMAL HIGH (ref 0.50–1.10)
GFR calc Af Amer: 16 mL/min — ABNORMAL LOW (ref >90)
GFR calc Af Amer: 14 mL/min — ABNORMAL LOW (ref >90)
GFR calc non Af Amer: 14 mL/min — ABNORMAL LOW (ref >90)
GFR, EST AFRICAN AMERICAN: 16 mL/min — AB (ref >90)
GFR, EST NON AFRICAN AMERICAN: 14 mL/min — AB (ref >90)
GFR, EST NON AFRICAN AMERICAN: 12 mL/min — AB (ref >90)
GLUCOSE: 187 mg/dL — AB (ref 70–99)
Glucose, Bld: 149 mg/dL — ABNORMAL HIGH (ref 70–99)
Glucose, Bld: 258 mg/dL — ABNORMAL HIGH (ref 70–99)
POTASSIUM: 6.3 mmol/L — AB (ref 3.5–5.1)
Potassium: 6.7 mmol/L (ref 3.5–5.1)
Potassium: 6.3 mmol/L (ref 3.5–5.1)
SODIUM: 141 mmol/L (ref 135–145)
SODIUM: 137 mmol/L (ref 135–145)
Sodium: 140 mmol/L (ref 135–145)

## 2014-10-03 LAB — BRAIN NATRIURETIC PEPTIDE: B Natriuretic Peptide: 268.4 pg/mL — ABNORMAL HIGH (ref 0.0–100.0)

## 2014-10-03 LAB — URINALYSIS, ROUTINE W REFLEX MICROSCOPIC
Bilirubin Urine: NEGATIVE
GLUCOSE, UA: NEGATIVE mg/dL
Hgb urine dipstick: NEGATIVE
KETONES UR: NEGATIVE mg/dL
Leukocytes, UA: NEGATIVE
Nitrite: NEGATIVE
PH: 5 (ref 5.0–8.0)
Protein, ur: NEGATIVE mg/dL
SPECIFIC GRAVITY, URINE: 1.017 (ref 1.005–1.030)
UROBILINOGEN UA: 0.2 mg/dL (ref 0.0–1.0)

## 2014-10-03 LAB — POTASSIUM: Potassium: 6.2 mmol/L (ref 3.5–5.1)

## 2014-10-03 LAB — COMPREHENSIVE METABOLIC PANEL
ALBUMIN: 3.1 g/dL — AB (ref 3.5–5.2)
ALK PHOS: 150 U/L — AB (ref 39–117)
ALT: 16 U/L (ref 0–35)
AST: 25 U/L (ref 0–37)
Anion gap: 5 (ref 5–15)
BILIRUBIN TOTAL: 0.9 mg/dL (ref 0.3–1.2)
BUN: 72 mg/dL — ABNORMAL HIGH (ref 6–23)
CO2: 34 mmol/L — ABNORMAL HIGH (ref 19–32)
Calcium: 9.1 mg/dL (ref 8.4–10.5)
Chloride: 98 mEq/L (ref 96–112)
Creatinine, Ser: 2.42 mg/dL — ABNORMAL HIGH (ref 0.50–1.10)
GFR calc Af Amer: 18 mL/min — ABNORMAL LOW (ref >90)
GFR calc non Af Amer: 15 mL/min — ABNORMAL LOW (ref >90)
Glucose, Bld: 164 mg/dL — ABNORMAL HIGH (ref 70–99)
Potassium: 6.2 mmol/L (ref 3.5–5.1)
Sodium: 137 mmol/L (ref 135–145)
Total Protein: 6.6 g/dL (ref 6.0–8.3)

## 2014-10-03 LAB — HEMOGLOBIN A1C
Hgb A1c MFr Bld: 5.6 % (ref <5.7)
Mean Plasma Glucose: 114 mg/dL (ref <117)

## 2014-10-03 LAB — INFLUENZA PANEL BY PCR (TYPE A & B)
H1N1 flu by pcr: NOT DETECTED
Influenza A By PCR: NEGATIVE
Influenza B By PCR: NEGATIVE

## 2014-10-03 LAB — CBG MONITORING, ED: Glucose-Capillary: 261 mg/dL — ABNORMAL HIGH (ref 70–99)

## 2014-10-03 LAB — TSH: TSH: 1.057 u[IU]/mL (ref 0.350–4.500)

## 2014-10-03 LAB — TROPONIN I: Troponin I: <0.03 ng/mL (ref <0.031)

## 2014-10-03 LAB — GLUCOSE, CAPILLARY: Glucose-Capillary: 143 mg/dL — ABNORMAL HIGH (ref 70–99)

## 2014-10-03 LAB — STREP PNEUMONIAE URINARY ANTIGEN: Strep Pneumo Urinary Antigen: NEGATIVE

## 2014-10-03 LAB — SODIUM, URINE, RANDOM: Sodium, Ur: <10 mmol/L

## 2014-10-03 LAB — LACTIC ACID, PLASMA: Lactic Acid, Venous: 1 mmol/L (ref 0.5–2.0)

## 2014-10-03 LAB — CREATININE, URINE, RANDOM: Creatinine, Urine: 111.09 mg/dL

## 2014-10-03 LAB — MRSA PCR SCREENING: MRSA BY PCR: NEGATIVE

## 2014-10-03 MED ORDER — DEXTROSE 50 % IV SOLN
50.0000 mL | Freq: Once | INTRAVENOUS | Status: AC
  Administered 2014-10-03: 50 mL via INTRAVENOUS
  Filled 2014-10-03: qty 50

## 2014-10-03 MED ORDER — ACETAMINOPHEN 650 MG RE SUPP: 650.0000 mg | Freq: Four times a day (QID) | RECTAL | Status: DC | PRN

## 2014-10-03 MED ORDER — SODIUM CHLORIDE 0.9 % IV BOLUS (SEPSIS)
1000.0000 mL | Freq: Once | INTRAVENOUS | Status: AC
  Administered 2014-10-03: 1000 mL via INTRAVENOUS

## 2014-10-03 MED ORDER — DEXTROSE 50 % IV SOLN
1.0000 | Freq: Once | INTRAVENOUS | Status: AC
  Filled 2014-10-03: qty 50

## 2014-10-03 MED ORDER — APIXABAN 2.5 MG PO TABS
2.5000 mg | ORAL_TABLET | Freq: Two times a day (BID) | ORAL | Status: DC
  Filled 2014-10-03 (×2): qty 1

## 2014-10-03 MED ORDER — HEPARIN SODIUM (PORCINE) 5000 UNIT/ML IJ SOLN
5000.0000 [IU] | Freq: Three times a day (TID) | INTRAMUSCULAR | Status: DC
  Administered 2014-10-03 – 2014-10-05 (×6): 5000 [IU] via SUBCUTANEOUS
  Filled 2014-10-03 (×8): qty 1

## 2014-10-03 MED ORDER — CETYLPYRIDINIUM CHLORIDE 0.05 % MT LIQD
7.0000 mL | Freq: Two times a day (BID) | OROMUCOSAL | Status: DC
  Administered 2014-10-03 – 2014-10-04 (×4): 7 mL via OROMUCOSAL

## 2014-10-03 MED ORDER — VITAMIN B-1 100 MG PO TABS
100.0000 mg | ORAL_TABLET | Freq: Every day | ORAL | Status: DC
  Filled 2014-10-03 (×3): qty 1

## 2014-10-03 MED ORDER — IPRATROPIUM-ALBUTEROL 0.5-2.5 (3) MG/3ML IN SOLN: 3.0000 mL | Freq: Four times a day (QID) | RESPIRATORY_TRACT | Status: DC | PRN

## 2014-10-03 MED ORDER — DEXTROSE 50 % IV SOLN
INTRAVENOUS | Status: AC
  Administered 2014-10-03: 50 mL via INTRAVENOUS
  Filled 2014-10-03: qty 50

## 2014-10-03 MED ORDER — VANCOMYCIN HCL 500 MG IV SOLR: 500.0000 mg | INTRAVENOUS | Status: DC

## 2014-10-03 MED ORDER — VANCOMYCIN HCL IN DEXTROSE 750-5 MG/150ML-% IV SOLN
750.0000 mg | Freq: Once | INTRAVENOUS | Status: AC
Start: 2014-10-03 — End: 2014-10-03
  Administered 2014-10-03: 750 mg via INTRAVENOUS
  Filled 2014-10-03: qty 150

## 2014-10-03 MED ORDER — INSULIN ASPART 100 UNIT/ML IV SOLN
10.0000 [IU] | Freq: Once | INTRAVENOUS | Status: AC
  Administered 2014-10-03: 10 [IU] via INTRAVENOUS

## 2014-10-03 MED ORDER — WHITE PETROLATUM GEL
Status: AC
  Administered 2014-10-03: 0.2
  Filled 2014-10-03: qty 1

## 2014-10-03 MED ORDER — ALBUTEROL SULFATE (2.5 MG/3ML) 0.083% IN NEBU
10.0000 mg | INHALATION_SOLUTION | Freq: Once | RESPIRATORY_TRACT | Status: AC
  Administered 2014-10-03: 10 mg via RESPIRATORY_TRACT
  Filled 2014-10-03: qty 12

## 2014-10-03 MED ORDER — ONDANSETRON HCL 4 MG/2ML IJ SOLN
4.0000 mg | Freq: Four times a day (QID) | INTRAMUSCULAR | Status: DC | PRN
  Administered 2014-10-03: 4 mg via INTRAVENOUS
  Filled 2014-10-03: qty 2

## 2014-10-03 MED ORDER — ADULT MULTIVITAMIN W/MINERALS CH
1.0000 | ORAL_TABLET | Freq: Every day | ORAL | Status: DC
  Filled 2014-10-03 (×3): qty 1

## 2014-10-03 MED ORDER — DEXTROSE 5 % IV SOLN
1.0000 g | Freq: Every day | INTRAVENOUS | Status: DC
  Administered 2014-10-03: 1 g via INTRAVENOUS
  Filled 2014-10-03: qty 1

## 2014-10-03 MED ORDER — SODIUM CHLORIDE 0.9 % IJ SOLN
3.0000 mL | Freq: Two times a day (BID) | INTRAMUSCULAR | Status: DC
  Administered 2014-10-03: 3 mL via INTRAVENOUS

## 2014-10-03 MED ORDER — SENNOSIDES-DOCUSATE SODIUM 8.6-50 MG PO TABS
1.0000 | ORAL_TABLET | Freq: Two times a day (BID) | ORAL | Status: DC
  Filled 2014-10-03 (×2): qty 1

## 2014-10-03 MED ORDER — SODIUM CHLORIDE 0.9 % IV SOLN
INTRAVENOUS | Status: DC
  Administered 2014-10-03 – 2014-10-05 (×5): via INTRAVENOUS

## 2014-10-03 MED ORDER — BENAZEPRIL HCL 20 MG PO TABS
20.0000 mg | ORAL_TABLET | Freq: Every day | ORAL | Status: DC
  Filled 2014-10-03: qty 1

## 2014-10-03 MED ORDER — DEXTROSE 5 % IV SOLN
500.0000 mg | INTRAVENOUS | Status: DC
  Administered 2014-10-04 – 2014-10-05 (×2): 500 mg via INTRAVENOUS
  Filled 2014-10-03 (×2): qty 0.5

## 2014-10-03 MED ORDER — INSULIN ASPART 100 UNIT/ML IV SOLN
10.0000 [IU] | Freq: Once | INTRAVENOUS | Status: AC
  Administered 2014-10-03: 10 [IU] via INTRAVENOUS
  Filled 2014-10-03: qty 1

## 2014-10-03 MED ORDER — SODIUM POLYSTYRENE SULFONATE 15 GM/60ML PO SUSP: 30.0000 g | Freq: Once | ORAL | Status: DC

## 2014-10-03 MED ORDER — FOLIC ACID 1 MG PO TABS
1.0000 mg | ORAL_TABLET | Freq: Every day | ORAL | Status: DC
  Filled 2014-10-03 (×3): qty 1

## 2014-10-03 MED ORDER — ACETAMINOPHEN 325 MG PO TABS
650.0000 mg | ORAL_TABLET | Freq: Four times a day (QID) | ORAL | Status: DC | PRN
Start: 2014-10-03 — End: 2014-10-05

## 2014-10-03 MED ORDER — SODIUM CHLORIDE 0.9 % IV SOLN
1.0000 g | Freq: Once | INTRAVENOUS | Status: AC
  Administered 2014-10-03: 1 g via INTRAVENOUS
  Filled 2014-10-03: qty 10

## 2014-10-03 MED ORDER — LORAZEPAM 2 MG/ML IJ SOLN
0.2500 mg | Freq: Once | INTRAMUSCULAR | Status: AC
  Administered 2014-10-03: 0.25 mg via INTRAVENOUS
  Filled 2014-10-03: qty 1

## 2014-10-03 MED ORDER — AMLODIPINE BESYLATE 5 MG PO TABS
5.0000 mg | ORAL_TABLET | Freq: Every day | ORAL | Status: DC
  Filled 2014-10-03: qty 1

## 2014-10-03 MED ORDER — SODIUM POLYSTYRENE SULFONATE 15 GM/60ML PO SUSP
30.0000 g | Freq: Once | ORAL | Status: AC
  Administered 2014-10-03: 30 g via ORAL
  Filled 2014-10-03: qty 120

## 2014-10-03 MED ORDER — ONDANSETRON HCL 4 MG PO TABS: 4.0000 mg | ORAL_TABLET | Freq: Four times a day (QID) | ORAL | Status: DC | PRN

## 2014-10-03 NOTE — Progress Notes (Signed)
Patient transferred to [facility]. Report given to RN. Family at bedside during transfer. Rapid response at bedside during transfer. Patient stable.

## 2014-10-03 NOTE — Progress Notes (Signed)
CRITICAL VALUE ALERT  Critical value received:  k 6.7 Date of notification:  10/03/14  Time of notification:  9935  Critical value read back:Yes.    Nurse who received alert:  Tora Kindred  MD notified (1st page):  triad  Time of first page:  1024  MD notified (2nd page):  Time of second page:  Responding MD:  Triad   Time MD responded:

## 2014-10-03 NOTE — Progress Notes (Addendum)
ANTIBIOTIC CONSULT NOTE - INITIAL  Pharmacy Consult for Vancomycin and Cefepime Indication: rule out pneumonia  Allergies  Allergen Reactions  . Olmesartan Medoxomil Diarrhea and Nausea And Vomiting    Patient Measurements: Height: 5\' 2"  (157.5 cm) Weight: 97 lb (43.999 kg) IBW/kg (Calculated) : 50.1  Vital Signs: Temp: 98.2 F (36.8 C) (01/21 2233) Temp Source: Axillary (01/21 2233) BP: 123/35 mmHg (01/22 0115) Pulse Rate: 63 (01/22 0115) Intake/Output from previous day:   Intake/Output from this shift:    Labs:  Recent Labs  10/01/2014 2323  WBC 8.8  HGB 11.4*  PLT 251  CREATININE 2.42*   Estimated Creatinine Clearance: 8.6 mL/min (by C-G formula based on Cr of 2.42). No results for input(s): VANCOTROUGH, VANCOPEAK, VANCORANDOM, GENTTROUGH, GENTPEAK, GENTRANDOM, TOBRATROUGH, TOBRAPEAK, TOBRARND, AMIKACINPEAK, AMIKACINTROU, AMIKACIN in the last 72 hours.   Microbiology: No results found for this or any previous visit (from the past 720 hour(s)).  Medical History: Past Medical History  Diagnosis Date  . Hypertension   . Hyperlipidemia   . Glucose intolerance (impaired glucose tolerance)   . Osteoporosis   . Atrial fibrillation   . Diastolic CHF, acute   . Stroke 2014    "mini stroke"  . Osteoarthritis     "back mainly" (09/17/2014)  . Pelvic fracture 09/17/2014    Medications:  Prescriptions prior to admission  Medication Sig Dispense Refill Last Dose  . acetaminophen (TYLENOL) 325 MG tablet Take 2 tablets (650 mg total) by mouth every 6 (six) hours as needed for mild pain (or Fever >/= 101). 30 tablet 0 unk  . amLODipine (NORVASC) 5 MG tablet Take 1 tablet (5 mg total) by mouth daily. 90 tablet 1 10/11/2014 at Unknown time  . amoxicillin-clavulanate (AUGMENTIN) 500-125 MG per tablet Take 1 tablet by mouth every 12 (twelve) hours. For 7 days   10/10/2014 at Unknown time  . apixaban (ELIQUIS) 2.5 MG TABS tablet Take 1 tablet (2.5 mg total) by mouth 2 (two) times  daily. 180 tablet 1 09/16/2014 at Unknown time  . benazepril (LOTENSIN) 20 MG tablet Take 1 tablet (20 mg total) by mouth daily. 90 tablet 1 09/21/2014 at Unknown time  . furosemide (LASIX) 40 MG tablet Take 1 tablet (40 mg total) by mouth daily. 90 tablet 1 09/27/2014 at Unknown time  . guaiFENesin-dextromethorphan (ROBITUSSIN DM) 100-10 MG/5ML syrup Take 10 mLs by mouth every 6 (six) hours as needed for cough.   10/08/2014 at Unknown time  . hydrALAZINE (APRESOLINE) 25 MG tablet Take 1 tablet (25 mg total) by mouth 4 (four) times daily. 360 tablet 1 09/17/2014 at Unknown time  . ipratropium-albuterol (DUONEB) 0.5-2.5 (3) MG/3ML SOLN Take 3 mLs by nebulization every 6 (six) hours as needed.   09/17/2014 at Unknown time  . oxyCODONE (OXY IR/ROXICODONE) 5 MG immediate release tablet Take one tablet by mouth every six hours for pain (Patient taking differently: Take 5 mg by mouth 3 (three) times daily. ) 120 tablet 0 09/23/2014 at Unknown time  . potassium chloride SA (KLOR-CON M20) 20 MEQ tablet Take 1 tablet (20 mEq total) by mouth daily. 90 tablet 1 09/13/2014 at Unknown time  . Sennosides-Docusate Sodium (SENNA PLUS PO) Take 1 tablet by mouth 2 (two) times daily.    09/29/2014 at Unknown time   Assessment: 81 yo female with RLL PNA and ARF for empiric antibiotics  Goal of Therapy:  Vancomycin trough level 15-20 mcg/ml  Plan:  Vancomycin 750 mg IV now, then 500 mg IV q72h Change Cefepime  1 g IV q24h   Abbott, Bronson Curb 10/03/2014,2:19 AM  ------------------------------------------------------------------------------------------------------------- Addendum:  Given the patient's age and renal function - will further reduce Cefepime dose this AM.  Plan: 1. Reduce Cefepime to 500 mg IV every 24 hours 2. Continue Vancomycin 500 mg IV every 72 hours (as ordered above) 2. Will continue to follow renal function, culture results, LOT, and antibiotic de-escalation plans   Alycia Rossetti,  PharmD, BCPS Clinical Pharmacist Pager: [phone] 10/03/2014 7:39 AM

## 2014-10-03 NOTE — Progress Notes (Signed)
Pt seen and examined with Dr. Marvel Plan. Case d/w residents in detail.  In brief, patient is a 81 year old female with PMH of HTN, HLD, afib, recently dicharged on 09/20/14 to SNF after being admitted for non displaced pelvic fx who p/w cough and decreased responsiveness. Per daughter patient was doing well with rehab after discharge but developed a cough 1 week ago which was productive of yellowish sputum. No hemoptysis. Pt initially was treated with robitussin and breathing treatments and CXR was normal. Over the next 4-5 days she became progressively more fatigued and less responsive. Per daughter pt has not had a BM in 4-5 days and has had decreased appetite.Pt also had not urinated since yesterday. She was brought to the ED for further w/u. Pt is currently somnolent and unable to answer questions. Per chart pt was given ativan overnight for restlessness and has been somnolent since.  Physical Exam: Gen: somnolent, unarousable CVS: irregularly irregular Lungs: agonal breathing, diffuse rhonchi Abd: distended, BS + Ext: no edema  Assessment and Plan: 81 y/o female p/w decreased responsiveness cough found to have pneumonia course complicated by AKI, hyperkalemia   HCAP: - CXR with R LL opacity consistent with pneumonia - c/w cefepime and vancomycin for now - f/u blood cx - monitor O2 sats  AKI, hyperkalemia: - likely ATN v/s prerenal - c/w IVF for now - check renal sono, u/a, urine lytes - nephrology recommendations appreciated - Pt with severe hyperkalemia but not a candidate for HD or kayexalate per nephrology - will monitor BMP  HTN:  - BP now low normal - hold anti hypertensives and monitor  Pt with poor prognosis and this was discussed with the daughter by myself and the residents. Daughter expressed understanding. Pt is DNR. No heroic measures to be undertaken.

## 2014-10-03 NOTE — Progress Notes (Signed)
Repeat K+-6.3 md aware with no order.

## 2014-10-03 NOTE — Progress Notes (Signed)
OT Cancellation Note  Patient Details Name: Debbie Spears MRN: [id number] DOB: 07/19/1912   Cancelled Treatment:    Reason Eval/Treat Not Completed: Other (comment) Pt is Medicare and is from [facility] current D/C plan is SNF. No apparent immediate acute care OT needs, therefore will defer OT to SNF. If OT eval is needed please call Acute Rehab Dept. at [phone] or text page OT at [phone].   [place], OTR/L  [phone] 10/03/2014 10/03/2014, 2:02 PM

## 2014-10-03 NOTE — Progress Notes (Signed)
Inpatient Diabetes Program Recommendations  AACE/ADA: New Consensus Statement on Inpatient Glycemic Control (2013)  Target Ranges:  Prepandial:   less than 140 mg/dL      Peak postprandial:   less than 180 mg/dL (1-2 hours)      Critically ill patients:  140 - 180 mg/dL  Results for TYRIKA, NEWMAN (MRN [id number]) as of 10/03/2014 09:15  Ref. Range 10/03/2014 01:44 10/03/2014 08:05  Glucose-Capillary Latest Range: 70-99 mg/dL 261 (H) 143 (H)   Diabetes history: NO Outpatient Diabetes medications: NA Current orders for Inpatient glycemic control: CBGs QAM  Inpatient Diabetes Program Recommendations Correction (SSI): While inpatient, please consider ordering CBGs with Novolog correction scale. HgbA1C: Noted A1C has been ordered and is in process.  Thanks, Barnie Alderman, RN, MSN, CCRN, CDE Diabetes Coordinator Inpatient Diabetes Program [phone] (Team Pager) [phone] (AP office) [phone] [facility] office)

## 2014-10-03 NOTE — Consult Note (Signed)
Renal Service Consult Note [facility]  Debbie Spears 10/03/2014 Debbie Spears Requesting Physician:  Dr Humphrey Rolls  Reason for Consult:  Renal failure and hyperkalemia HPI: The patient is a 81 y.o. year-old with hx of HTN, afib, diast CHF and DJD. Has declined since fall / pelvic fracture early January. Admitted now from SNF with cough and congestion yesterday.  CXR showed new RLL infiltrate and she was admitted and started on IV abx for suspected HCAP.  IVF's were given but renal function has worsened w creat up at 2.42 on admission (1.54 on Jan 6th) and 2.67 today.  K+ up at 6.2 on admission and 6.8 today.  Rhythm is afib with VR 60 - 90, no brady or ectopy.  BP 100 / 70.    Reportedly pt was agitated early this am and rec'd IV Ativan.  Was moved to ICU and now is unresponsive.   ROS  n/a  Past Medical History  Past Medical History  Diagnosis Date  . Hypertension   . Hyperlipidemia   . Glucose intolerance (impaired glucose tolerance)   . Osteoporosis   . Atrial fibrillation   . Diastolic CHF, acute   . Stroke 2014    "mini stroke"  . Osteoarthritis     "back mainly" (09/17/2014)  . Pelvic fracture 09/17/2014   Past Surgical History  Past Surgical History  Procedure Laterality Date  . Laparoscopic cholecystectomy  March 02, 2007  . Cataract extraction w/ intraocular lens  implant, bilateral Bilateral 2000's   Family History  Family History  Problem Relation Age of Onset  . Stroke Mother   . Heart attack Brother    Social History  reports that she has never smoked. She has never used smokeless tobacco. She reports that she does not drink alcohol or use illicit drugs. Allergies  Allergies  Allergen Reactions  . Olmesartan Medoxomil Diarrhea and Nausea And Vomiting   Home medications Prior to Admission medications   Medication Sig Start Date End Date Taking? Authorizing Provider  acetaminophen (TYLENOL) 325 MG tablet Take 2 tablets (650 mg total) by mouth  every 6 (six) hours as needed for mild pain (or Fever >/= 101). 09/19/14  Yes Alexa Marvel Plan, MD  amLODipine (NORVASC) 5 MG tablet Take 1 tablet (5 mg total) by mouth daily. 08/08/14  Yes Fay Records, MD  amoxicillin-clavulanate (AUGMENTIN) 500-125 MG per tablet Take 1 tablet by mouth every 12 (twelve) hours. For 7 days   Yes Historical Provider, MD  apixaban (ELIQUIS) 2.5 MG TABS tablet Take 1 tablet (2.5 mg total) by mouth 2 (two) times daily. 08/08/14  Yes Fay Records, MD  benazepril (LOTENSIN) 20 MG tablet Take 1 tablet (20 mg total) by mouth daily. 08/08/14  Yes Fay Records, MD  furosemide (LASIX) 40 MG tablet Take 1 tablet (40 mg total) by mouth daily. 08/08/14  Yes Fay Records, MD  guaiFENesin-dextromethorphan (ROBITUSSIN DM) 100-10 MG/5ML syrup Take 10 mLs by mouth every 6 (six) hours as needed for cough.   Yes Historical Provider, MD  hydrALAZINE (APRESOLINE) 25 MG tablet Take 1 tablet (25 mg total) by mouth 4 (four) times daily. 08/08/14  Yes Fay Records, MD  ipratropium-albuterol (DUONEB) 0.5-2.5 (3) MG/3ML SOLN Take 3 mLs by nebulization every 6 (six) hours as needed.   Yes Historical Provider, MD  oxyCODONE (OXY IR/ROXICODONE) 5 MG immediate release tablet Take one tablet by mouth every six hours for pain Patient taking differently: Take 5 mg by mouth 3 (  three) times daily.  09/26/14  Yes [employer], DO  potassium chloride SA (KLOR-CON M20) 20 MEQ tablet Take 1 tablet (20 mEq total) by mouth daily. 08/08/14  Yes Fay Records, MD  Sennosides-Docusate Sodium (SENNA PLUS PO) Take 1 tablet by mouth 2 (two) times daily.    Yes Historical Provider, MD   Liver Function Tests  Recent Labs Lab 10/06/2014 2323  AST 25  ALT 16  ALKPHOS 150*  BILITOT 0.9  PROT 6.6  ALBUMIN 3.1*   No results for input(s): LIPASE, AMYLASE in the last 168 hours. CBC  Recent Labs Lab 09/20/2014 2323  WBC 8.8  NEUTROABS 7.5  HGB 11.4*  HCT 36.2  MCV 102.3*  PLT 256   Basic Metabolic  Panel  Recent Labs Lab 09/13/2014 2323 10/03/14 0032 10/03/14 0925  NA 137  --  141  K 6.2* 6.2* 6.7*  CL 98  --  102  CO2 34*  --  31  GLUCOSE 164*  --  149*  BUN 72*  --  82*  CREATININE 2.42*  --  2.67*  CALCIUM 9.1  --  8.9    Filed Vitals:   10/03/14 0115 10/03/14 0219 10/03/14 0448 10/03/14 1000  BP: 123/35 114/42 107/33 103/41  Pulse: 63 67 68 74  Temp:  98.2 F (36.8 C) 98.3 F (36.8 C) 98.1 F (36.7 C)  TempSrc:  Oral Oral Oral  Resp: _0 Height:  5' (1.524 m)    Weight:  46.448 kg (102 lb 6.4 oz)    SpO2: 94% 93% 97% 95%   Exam Elderly WF early agonal breathing pattern No rash, cyanosis or gangrene Sclera anicteric, throat dry No jvd Chest with no air movement R lung, L lung basilar coarse rales post Cor irreg rhythm, no MRG Abd soft, protuberant a bit, +BS, no mass GU foley draining yellow clear urine No LE or UE edema, no sores, ulcers or wounds Neuro is unresponsive to loud voice or tactile stimuli at 1:00 pm   Assessment: 1. Hyperkalemia 2. Acute on CKD4 (baseline creat 1.5, baseline eGFR 28 ml/min) 3. PNA on IV abx - may have plugged R lung 4. DNR 5. HTN - BP's soft now 6. Afib   Plan - patient too sick right now for oral or rectal Rx of hyperkalemia, and not HD candidate. Agonal breathing pattern and dec'd MS, poor prognosis , have d/w family and notified them that she may expire soon, they are supportive and don't want heroic measures.  Don't see anything that can be done about hyperkalemia right now.  DNR.  Have d/w primary MD.   Kelly Splinter MD (pgr) [phone]    ([phone] 10/03/2014, 1:00 PM

## 2014-10-03 NOTE — H&P (Signed)
[facility] History and Physical  BRANDON SCARBROUGH VOH:[id number] DOB: 20-Mar-1912 DOA: 09/22/2014  Referring physician: Julianne Rice, MD PCP: Limmie Patricia, MD   Chief Complaint: Cough and Congestion  HPI: Debbie Spears is a 81 y.o. female presents with cough and congestion. Patient sustained a fall about 2 weeks ago and suffered a pelvic fracture. She was sent to rehab after 3 days. Patient did not have surgery. Patient started to have a cough around Sunday. Patient has had increased cough. Patient had a CXR done and this showed presence of no infiltrate. Patient was started on robitussin and inhalers. She appeared to get worse and she was started on augmentin. Today however her daughter asked that she come to the ED. Currently she is lethargic and has been having difficulty eating and drinking.   Review of Systems:  Patient is not able to provide a full ROS  Past Medical History  Diagnosis Date  . Hypertension   . Hyperlipidemia   . Glucose intolerance (impaired glucose tolerance)   . Osteoporosis   . Atrial fibrillation   . Diastolic CHF, acute   . Stroke 2014    "mini stroke"  . Osteoarthritis     "back mainly" (09/17/2014)  . Pelvic fracture 09/17/2014   Past Surgical History  Procedure Laterality Date  . Laparoscopic cholecystectomy  March 02, 2007  . Cataract extraction w/ intraocular lens  implant, bilateral Bilateral 2000's   Social History:  reports that she has never smoked. She has never used smokeless tobacco. She reports that she does not drink alcohol or use illicit drugs.  Allergies  Allergen Reactions  . Olmesartan Medoxomil Diarrhea and Nausea And Vomiting    Family History  Problem Relation Age of Onset  . Stroke Mother   . Heart attack Brother      Prior to Admission medications   Medication Sig Start Date End Date Taking? Authorizing Provider  acetaminophen (TYLENOL) 325 MG tablet Take 2 tablets (650 mg total) by mouth every 6 (six)  hours as needed for mild pain (or Fever >/= 101). 09/19/14  Yes Alexa Marvel Plan, MD  amLODipine (NORVASC) 5 MG tablet Take 1 tablet (5 mg total) by mouth daily. 08/08/14  Yes Fay Records, MD  amoxicillin-clavulanate (AUGMENTIN) 500-125 MG per tablet Take 1 tablet by mouth every 12 (twelve) hours. For 7 days   Yes Historical Provider, MD  apixaban (ELIQUIS) 2.5 MG TABS tablet Take 1 tablet (2.5 mg total) by mouth 2 (two) times daily. 08/08/14  Yes Fay Records, MD  benazepril (LOTENSIN) 20 MG tablet Take 1 tablet (20 mg total) by mouth daily. 08/08/14  Yes Fay Records, MD  furosemide (LASIX) 40 MG tablet Take 1 tablet (40 mg total) by mouth daily. 08/08/14  Yes Fay Records, MD  guaiFENesin-dextromethorphan (ROBITUSSIN DM) 100-10 MG/5ML syrup Take 10 mLs by mouth every 6 (six) hours as needed for cough.   Yes Historical Provider, MD  hydrALAZINE (APRESOLINE) 25 MG tablet Take 1 tablet (25 mg total) by mouth 4 (four) times daily. 08/08/14  Yes Fay Records, MD  ipratropium-albuterol (DUONEB) 0.5-2.5 (3) MG/3ML SOLN Take 3 mLs by nebulization every 6 (six) hours as needed.   Yes Historical Provider, MD  oxyCODONE (OXY IR/ROXICODONE) 5 MG immediate release tablet Take one tablet by mouth every six hours for pain Patient taking differently: Take 5 mg by mouth 3 (three) times daily.  09/26/14  Yes [employer], DO  potassium chloride SA (KLOR-CON M20) 20  MEQ tablet Take 1 tablet (20 mEq total) by mouth daily. 08/08/14  Yes Fay Records, MD  Sennosides-Docusate Sodium (SENNA PLUS PO) Take 1 tablet by mouth 2 (two) times daily.    Yes Historical Provider, MD   Physical Exam: Filed Vitals:   09/22/2014 2233 09/28/2014 2236  BP: 132/47   Pulse: 72   Temp: 98.2 F (36.8 C)   TempSrc: Axillary   Resp: 26   Height: 5\' 2"  (1.575 m)   Weight: 43.999 kg (97 lb)   SpO2: 94% 96%    Wt Readings from Last 3 Encounters:  09/16/2014 43.999 kg (97 lb)  09/20/14 44.4 kg (97 lb 14.2 oz)  01/03/14  46.267 kg (102 lb)    General:  Appears calm and comfortable Eyes: PERRL, normal lids, irises & conjunctiva ENT: decreased hearing Neck: no LAD, masses Cardiovascular: IRR, no m/r/g. No LE edema. Respiratory: Very Coarse BS bilaterally Normal respiratory effort. Abdomen: soft, ntnd Skin: no rash or induration seen on limited exam Musculoskeletal: grossly normal tone BUE/BLE Psychiatric: Unable to assess Neurologic: Unable to assess          Labs on Admission:  Basic Metabolic Panel:  Recent Labs Lab 10/11/2014 2323  NA 137  K 6.2*  CL 98  CO2 34*  GLUCOSE 164*  BUN 72*  CREATININE 2.42*  CALCIUM 9.1   Liver Function Tests:  Recent Labs Lab 09/12/2014 2323  AST 25  ALT 16  ALKPHOS 150*  BILITOT 0.9  PROT 6.6  ALBUMIN 3.1*   No results for input(s): LIPASE, AMYLASE in the last 168 hours. No results for input(s): AMMONIA in the last 168 hours. CBC:  Recent Labs Lab 09/16/2014 2323  WBC 8.8  NEUTROABS 7.5  HGB 11.4*  HCT 36.2  MCV 102.3*  PLT 251   Cardiac Enzymes:  Recent Labs Lab 09/21/2014 2323  TROPONINI <0.03    BNP (last 3 results) No results for input(s): PROBNP in the last 8760 hours. CBG: No results for input(s): GLUCAP in the last 168 hours.  Radiological Exams on Admission: Dg Chest Port 1 View  10/01/2014   CLINICAL DATA:  SOB and cough X a few daysHx Afib and HTN, controlled  EXAM: PORTABLE CHEST - 1 VIEW  COMPARISON:  09/17/2014  FINDINGS: New opacity at the right lung base obscures the right heart border and right hemidiaphragm. This is consistent with a combination of pneumonia and pleural fluid.  Remainder the lungs show chronic interstitial prominence but no areas of consolidation. No left pleural effusion. No pneumothorax.  Cardiac silhouette is mildly enlarged. No gross mediastinal or hilar masses.  Bony thorax is diffusely demineralized.  IMPRESSION: New right lower lung zone opacity consistent with a combination of pneumonia and small  to moderate pleural effusion.   Electronically Signed   By: Lajean Manes M.D.   On: 09/25/2014 22:53      Assessment/Plan Active Problems:   Atrial fibrillation   RLL pneumonia   AKI (acute kidney injury)   Hyperkalemia   Elevated glucose   1. RLL Pneumonia -coming from a health care facility -will treat as a HCAP -started on cefepime and Vancocin -cultures ordered check flu  2. AKI -will hydrate -;ikely dehydrated  3. Hyperkalemia -will give kayexalate -repeat labs  4. Elevated Glucose -will monitor FSBS -Insulin as needed -Not known to be a diabetic -will check A1C  5. Atrial Fibrillation -Rate controlled -continue with eloquis     Code Status: DNR (must indicate code status--if unknown  or must be presumed, indicate so) DVT Prophylaxis:Eloquis Family Communication: Daughter (indicate person spoken with, if applicable, with phone number if by telephone) Disposition Plan: SNF (indicate anticipated LOS)  Time spent: 39min  Ashon Rosenberg A [facility] Pager [phone]

## 2014-10-03 NOTE — Progress Notes (Signed)
PT Cancellation Note  Patient Details Name: Debbie Spears MRN: [id number] DOB: [birth year]   Cancelled Treatment:    Reason Eval/Treat Not Completed: Patient's level of consciousness;Fatigue/lethargy limiting ability to participate. Pt unable to arouse this morning. Spoke with family, requesting PT return tomorrow.    Elie Confer [place] , [place]  10/03/2014, 10:06 AM

## 2014-10-03 NOTE — Progress Notes (Signed)
Subjective:  Patient was seen and examined today. Patient is sleeping.  History taken from daughter. Patient was discharged on 09/20/14 in good condition after being admitted for a non-displaced pelvic fracture. Patient was discharged to SNF at [facility] 1 week ago, patient developed a productive cough. CXR at that time was normal and she was treated with Robitussin and breathing treatments. Patient began to decline over the next 5 days and appeared more somnolent and less responsive. Patient had not been eating nor had a bowel movement in 4-5 days. She was very weak and requiring a wheelchair. Patient's daughter was concerned and brought her to the ED. Patient was hyperkalemic, with AKI and CXR revealed new right lower lung zone opacity consistent with a combination of pneumonia and small to moderate pleural effusion.   Objective: Vital signs in last 24 hours: Filed Vitals:   10/03/14 0219 10/03/14 0448 10/03/14 1000 10/03/14 1300  BP: 114/42 107/33 103/41 90/26  Pulse: 67 68 74 64  Temp: 98.2 F (36.8 C) 98.3 F (36.8 C) 98.1 F (36.7 C)   TempSrc: Oral Oral Oral   Resp: 18 17 17 26   Height: 5' (1.524 m)     Weight: 46.448 kg (102 lb 6.4 oz)     SpO2: 93% 97% 95% 96%   Weight change:   Intake/Output Summary (Last 24 hours) at 10/03/14 1425 Last data filed at 10/03/14 1300  Gross per 24 hour  Intake     50 ml  Output      0 ml  Net     50 ml   Filed Vitals:   10/03/14 0219 10/03/14 0448 10/03/14 1000 10/03/14 1300  BP: 114/42 107/33 103/41 90/26  Pulse: 67 68 74 64  Temp: 98.2 F (36.8 C) 98.3 F (36.8 C) 98.1 F (36.7 C)   TempSrc: Oral Oral Oral   Resp: 18 17 17 26   Height: 5' (1.524 m)     Weight: 46.448 kg (102 lb 6.4 oz)     SpO2: 93% 97% 95% 96%   General: Vital signs reviewed.  Patient is well-developed and well-nourished, in no acute distress and cooperative with exam.  Cardiovascular: Irregularly irregular Pulmonary/Chest: Agonal respiration, decreased  breath sounds in right lung field and diffuse rhonchi. Abdominal: Distended, BS + Musculoskeletal: No joint deformities, erythema, or stiffness, ROM full and nontender. Extremities: No lower extremity edema bilaterally Neurological: Somnolent Psychiatric: Unable to assess  Lab Results: Basic Metabolic Panel:  Recent Labs Lab 10/03/14 0925 10/03/14 1244  NA 141 137  K 6.7* 6.3*  CL 102 104  CO2 31 22  GLUCOSE 149* 258*  BUN 82* 75*  CREATININE 2.67* 2.65*  CALCIUM 8.9 8.7   Liver Function Tests:  Recent Labs Lab 10/03/2014 2323  AST 25  ALT 16  ALKPHOS 150*  BILITOT 0.9  PROT 6.6  ALBUMIN 3.1*   CBC:  Recent Labs Lab 10/08/2014 2323  WBC 8.8  NEUTROABS 7.5  HGB 11.4*  HCT 36.2  MCV 102.3*  PLT 251   Cardiac Enzymes:  Recent Labs Lab 09/15/2014 2323  TROPONINI <0.03   CBG:  Recent Labs Lab 10/03/14 0144 10/03/14 0805  GLUCAP 261* 143*   Thyroid Function Tests:  Recent Labs Lab 10/03/14 0245  TSH 1.057    Micro Results: Recent Results (from the past 240 hour(s))  MRSA PCR Screening     Status: None   Collection Time: 10/03/14  2:15 AM  Result Value Ref Range Status   MRSA by PCR  NEGATIVE NEGATIVE Final    Comment:        The GeneXpert MRSA Assay (FDA approved for NASAL specimens only), is one component of a comprehensive MRSA colonization surveillance program. It is not intended to diagnose MRSA infection nor to guide or monitor treatment for MRSA infections.    Studies/Results: Dg Chest Port 1 View  09/28/2014   CLINICAL DATA:  SOB and cough X a few daysHx Afib and HTN, controlled  EXAM: PORTABLE CHEST - 1 VIEW  COMPARISON:  09/17/2014  FINDINGS: New opacity at the right lung base obscures the right heart border and right hemidiaphragm. This is consistent with a combination of pneumonia and pleural fluid.  Remainder the lungs show chronic interstitial prominence but no areas of consolidation. No left pleural effusion. No pneumothorax.   Cardiac silhouette is mildly enlarged. No gross mediastinal or hilar masses.  Bony thorax is diffusely demineralized.  IMPRESSION: New right lower lung zone opacity consistent with a combination of pneumonia and small to moderate pleural effusion.   Electronically Signed   By: Lajean Manes M.D.   On: 10/04/2014 22:53   Medications:  I have reviewed the patient's current medications. Prior to Admission:  Prescriptions prior to admission  Medication Sig Dispense Refill Last Dose  . acetaminophen (TYLENOL) 325 MG tablet Take 2 tablets (650 mg total) by mouth every 6 (six) hours as needed for mild pain (or Fever >/= 101). 30 tablet 0 unk  . amLODipine (NORVASC) 5 MG tablet Take 1 tablet (5 mg total) by mouth daily. 90 tablet 1 09/14/2014 at Unknown time  . amoxicillin-clavulanate (AUGMENTIN) 500-125 MG per tablet Take 1 tablet by mouth every 12 (twelve) hours. For 7 days   09/17/2014 at Unknown time  . apixaban (ELIQUIS) 2.5 MG TABS tablet Take 1 tablet (2.5 mg total) by mouth 2 (two) times daily. 180 tablet 1 09/19/2014 at Unknown time  . benazepril (LOTENSIN) 20 MG tablet Take 1 tablet (20 mg total) by mouth daily. 90 tablet 1 10/04/2014 at Unknown time  . furosemide (LASIX) 40 MG tablet Take 1 tablet (40 mg total) by mouth daily. 90 tablet 1 09/20/2014 at Unknown time  . guaiFENesin-dextromethorphan (ROBITUSSIN DM) 100-10 MG/5ML syrup Take 10 mLs by mouth every 6 (six) hours as needed for cough.   10/01/2014 at Unknown time  . hydrALAZINE (APRESOLINE) 25 MG tablet Take 1 tablet (25 mg total) by mouth 4 (four) times daily. 360 tablet 1 09/24/2014 at Unknown time  . ipratropium-albuterol (DUONEB) 0.5-2.5 (3) MG/3ML SOLN Take 3 mLs by nebulization every 6 (six) hours as needed.   09/30/2014 at Unknown time  . oxyCODONE (OXY IR/ROXICODONE) 5 MG immediate release tablet Take one tablet by mouth every six hours for pain (Patient taking differently: Take 5 mg by mouth 3 (three) times daily. ) 120 tablet 0 09/23/2014  at Unknown time  . potassium chloride SA (KLOR-CON M20) 20 MEQ tablet Take 1 tablet (20 mEq total) by mouth daily. 90 tablet 1 09/25/2014 at Unknown time  . Sennosides-Docusate Sodium (SENNA PLUS PO) Take 1 tablet by mouth 2 (two) times daily.    10/06/2014 at Unknown time   Scheduled Meds: . antiseptic oral rinse  7 mL Mouth Rinse BID  . [START ON 10/04/2014] ceFEPime (MAXIPIME) IV  500 mg Intravenous Q24H  . folic acid  1 mg Oral Daily  . heparin subcutaneous  5,000 Units Subcutaneous 3 times per day  . multivitamin with minerals  1 tablet Oral Daily  . senna-docusate  1 tablet Oral BID  . sodium chloride  3 mL Intravenous Q12H  . thiamine  100 mg Oral Daily  . [START ON 10/06/2014] vancomycin  500 mg Intravenous Q72H   Continuous Infusions: . sodium chloride 50 mL/hr at 10/03/14 0234   PRN Meds:.acetaminophen **OR** acetaminophen, ipratropium-albuterol, ondansetron **OR** ondansetron (ZOFRAN) IV Assessment/Plan: Active Problems:   Atrial fibrillation   RLL pneumonia   AKI (acute kidney injury)   Hyperkalemia   Elevated glucose  HCAP versus Aspiration Pneumonia: CXR revealed new right lower lung zone opacity consistent with a combination of pneumonia and small to moderate pleural effusion. Patient has been afebrile, low-normal blood pressure, but currently hypotensive at 90/26, HR 60-70s, RR 26, 96% on 4 L of oxygen. Labs this morning revealed no leukocytosis and lactic acid of 1.  -Cefepime 500 mg daily -Vanc 500 mg Q72 hours -Albuterol nebulizer -Duonebs Q6H prn -Cardiac monitor -Diet Carb Mod -DNR -Legionella antigen -Supplemental oxygen -Strep pneumo -NS 100 cc/hr  Hyperkalemia: K 6.2 on admission and given kayexalate, but patient had no resulting BM. This morning K 6.7. EKG revealed no evidence of peaked T waves without widened QRS. Patient was given albuterol nebulizer, calcium chloride, D50, Novolog 10 units once. We consulted Nephrology who kindly saw our patient and did  not recommend oral or rectal kayexalate as patient is currently too ill. She is not a HD. Repeat potassium was 6.3. -Repeat BMET at 1600 -Cardiac monitor  HTN: BP low-normal on admission, but patient currently hypotensive. Patient is on Amlodipine 5 mg daily, Benzazepril 20 mg daily, Lasix 40 mg daily, and Hydralazine 25 mg QID at home.  -Hold BP meds  Chronic Diastolic CHF: Patient is on Lasix 40 mg daily, Kdur 20 mEq daily and Benazepril 20 mg daily at home. Patient was continued on her home medications during admission and on discharge.  Atrial Fibrillation: Patient has a history of atrial fibrillation and is on Eliquis 2.5 mg BID at home. Patient is currently in atrial fibrillation, but rate controlled.   DVT/PE ppx: Heparin SQ TID  Code status and treatment plan was discussed with the patient's daughter. Current treatment for patient's conditions is appropriate and non-invasive. We confirmed with the family that patient is DNR. Family does not wish to pursue invasive therapies and they understand that patient has a poor prognosis and may pass away soon. We will continue to check on the patient and update the family.   Dispo: Disposition is deferred at this time, awaiting improvement of current medical problems.  Anticipated discharge in approximately 1-2 day(s).   The patient does have a current PCP Juanda Bond Altheimer, MD) and does not need an [facility] hospital follow-up appointment after discharge.  The patient does have transportation limitations that hinder transportation to clinic appointments.  .Services Needed at time of discharge: Y = Yes, Blank = No PT:   OT:   RN:   Equipment:   Other:     LOS: 1 day   Osa Craver, DO PGY-1 Internal Medicine Resident Pager # [phone] 10/03/2014 2:25 PM

## 2014-10-03 NOTE — Progress Notes (Signed)
New Admission Note:   Arrival Method: via stretcher from ED Mental Orientation: Alert and Oriented x4 Telemetry: Placed on box 6E02; A-fib Assessment: Completed Skin: warm and dry. Skin tear to right elbow and right leg with foam. Sacral foam applied IV: Left Forearm Normal Saline @ 50cc/hr per MD orders Pain: Denies Tubes: N/A Safety Measures: Educated on fall prevention safety plan, patient acknowledged and understood. Admission: Completed [address] Orientation: Patient has been orientated to the room, unit and staff.  Family: Daughter at bedside  Orders have been reviewed and implemented. Will continue to monitor the patient. Call light has been placed within reach and bed alarm has been activated.    Dorothea Glassman, RN  Phone number: [phone]

## 2014-10-04 ENCOUNTER — Inpatient Hospital Stay (HOSPITAL_COMMUNITY): Payer: Medicare Other

## 2014-10-04 LAB — GLUCOSE, CAPILLARY: Glucose-Capillary: 139 mg/dL — ABNORMAL HIGH (ref 70–99)

## 2014-10-04 LAB — URINE CULTURE
CULTURE: NO GROWTH
Colony Count: NO GROWTH

## 2014-10-04 LAB — BASIC METABOLIC PANEL
Anion gap: 10 (ref 5–15)
BUN: 84 mg/dL — AB (ref 6–23)
CO2: 26 mmol/L (ref 19–32)
CREATININE: 3.27 mg/dL — AB (ref 0.50–1.10)
Calcium: 9 mg/dL (ref 8.4–10.5)
Chloride: 105 mmol/L (ref 96–112)
GFR calc Af Amer: 12 mL/min — ABNORMAL LOW (ref >90)
GFR calc non Af Amer: 11 mL/min — ABNORMAL LOW (ref >90)
Glucose, Bld: 141 mg/dL — ABNORMAL HIGH (ref 70–99)
Potassium: 6.4 mmol/L (ref 3.5–5.1)
Sodium: 141 mmol/L (ref 135–145)

## 2014-10-04 LAB — SODIUM, URINE, RANDOM: SODIUM UR: 38 mmol/L

## 2014-10-04 LAB — CREATININE, URINE, RANDOM: CREATININE, URINE: 193.01 mg/dL

## 2014-10-04 MED ORDER — ALBUTEROL SULFATE (2.5 MG/3ML) 0.083% IN NEBU
10.0000 mg | INHALATION_SOLUTION | Freq: Once | RESPIRATORY_TRACT | Status: AC
  Administered 2014-10-04: 10 mg via RESPIRATORY_TRACT
  Filled 2014-10-04: qty 12

## 2014-10-04 MED ORDER — DEXTROSE 50 % IV SOLN
1.0000 | Freq: Once | INTRAVENOUS | Status: AC
  Administered 2014-10-04: 50 mL via INTRAVENOUS
  Filled 2014-10-04: qty 50

## 2014-10-04 MED ORDER — SODIUM CHLORIDE 0.9 % IV SOLN
1.0000 g | Freq: Once | INTRAVENOUS | Status: AC
  Administered 2014-10-04: 1 g via INTRAVENOUS
  Filled 2014-10-04: qty 10

## 2014-10-04 MED ORDER — INSULIN ASPART 100 UNIT/ML IV SOLN
10.0000 [IU] | Freq: Once | INTRAVENOUS | Status: AC
  Administered 2014-10-04: 10 [IU] via INTRAVENOUS

## 2014-10-04 MED ORDER — MORPHINE SULFATE 2 MG/ML IJ SOLN: 1.0000 mg | INTRAMUSCULAR | Status: DC | PRN

## 2014-10-04 MED ORDER — MORPHINE SULFATE 2 MG/ML IJ SOLN
0.5000 mg | INTRAMUSCULAR | Status: DC | PRN
  Administered 2014-10-04: 0.5 mg via INTRAVENOUS
  Filled 2014-10-04: qty 1

## 2014-10-04 MED ORDER — MORPHINE SULFATE 2 MG/ML IJ SOLN
0.5000 mg | Freq: Once | INTRAMUSCULAR | Status: AC
  Administered 2014-10-04: 0.5 mg via INTRAVENOUS
  Filled 2014-10-04: qty 1

## 2014-10-04 MED ORDER — MORPHINE SULFATE 2 MG/ML IJ SOLN
1.0000 mg | INTRAMUSCULAR | Status: DC | PRN
  Administered 2014-10-04 – 2014-10-05 (×2): 1 mg via INTRAVENOUS
  Filled 2014-10-04 (×2): qty 1

## 2014-10-04 NOTE — Progress Notes (Signed)
Internal Medicine Attending  Date: 10/04/2014  Patient name: Debbie Spears Medical record number: [id number] Date of birth: 02/18/1912 Age: 81 y.o. Gender: female  I saw and evaluated the patient, and discussed her care with resident on A.M rounds.  I reviewed the resident's note by Dr. Marvel Plan and I agree with the resident's findings and plans as documented in her note.

## 2014-10-04 NOTE — Progress Notes (Signed)
Subjective:  Patient seen and examined this morning. Patient is somnolent, but arousable. She denies pain.   Objective: Vital signs in last 24 hours: Filed Vitals:   10/04/14 0200 10/04/14 0400 10/04/14 0411 10/04/14 0600  BP: 104/34 116/26  110/24  Pulse: 67 71  67  Temp:  98 F (36.7 C)    TempSrc:  Oral    Resp: 25 31  27   Height:      Weight:      SpO2: 94% 91% 96% 88%   Weight change:   Intake/Output Summary (Last 24 hours) at 10/04/14 0656 Last data filed at 10/04/14 0600  Gross per 24 hour  Intake   1829 ml  Output    370 ml  Net   1459 ml   General: Vital signs reviewed. Patient is well-developed and well-nourished, in no acute distress and cooperative with exam.  Cardiovascular: Irregularly irregular Pulmonary/Chest: Tachypneic, decreased breath sounds in right lung field and diffuse rhonchi. Abdominal: Soft, nontender, nondistended, BS + Extremities: No lower extremity edema bilaterally Neurological: Somnolent, but arousable   Lab Results: Basic Metabolic Panel:  Recent Labs Lab 10/03/14 1554 10/04/14 0245  NA 140 141  K 6.3* 6.4*  CL 101 105  CO2 28 26  GLUCOSE 187* 141*  BUN 78* 84*  CREATININE 2.92* 3.27*  CALCIUM 9.8 9.0   Liver Function Tests:  Recent Labs Lab 09/13/2014 2323  AST 25  ALT 16  ALKPHOS 150*  BILITOT 0.9  PROT 6.6  ALBUMIN 3.1*   CBC:  Recent Labs Lab 09/14/2014 2323  WBC 8.8  NEUTROABS 7.5  HGB 11.4*  HCT 36.2  MCV 102.3*  PLT 251   Cardiac Enzymes:  Recent Labs Lab 10/03/2014 2323  TROPONINI <0.03   CBG:  Recent Labs Lab 10/03/14 0144 10/03/14 0805  GLUCAP 261* 143*   Hemoglobin A1C:  Recent Labs Lab 10/03/14 0215  HGBA1C 5.6   Thyroid Function Tests:  Recent Labs Lab 10/03/14 0245  TSH 1.057   Urinalysis:  Recent Labs Lab 10/03/14 1456  [place] 1.017  PHURINE 5.0  GLUCOSEU NEGATIVE  HGBUR NEGATIVE  BILIRUBINUR NEGATIVE  KETONESUR NEGATIVE  PROTEINUR  NEGATIVE  UROBILINOGEN 0.2  NITRITE NEGATIVE  LEUKOCYTESUR NEGATIVE   Micro Results: Recent Results (from the past 240 hour(s))  MRSA PCR Screening     Status: None   Collection Time: 10/03/14  2:15 AM  Result Value Ref Range Status   MRSA by PCR NEGATIVE NEGATIVE Final    Comment:        The GeneXpert MRSA Assay (FDA approved for NASAL specimens only), is one component of a comprehensive MRSA colonization surveillance program. It is not intended to diagnose MRSA infection nor to guide or monitor treatment for MRSA infections.    Studies/Results: Dg Chest Port 1 View  09/12/2014   CLINICAL DATA:  SOB and cough X a few daysHx Afib and HTN, controlled  EXAM: PORTABLE CHEST - 1 VIEW  COMPARISON:  09/17/2014  FINDINGS: New opacity at the right lung base obscures the right heart border and right hemidiaphragm. This is consistent with a combination of pneumonia and pleural fluid.  Remainder the lungs show chronic interstitial prominence but no areas of consolidation. No left pleural effusion. No pneumothorax.  Cardiac silhouette is mildly enlarged. No gross mediastinal or hilar masses.  Bony thorax is diffusely demineralized.  IMPRESSION: New right lower lung zone opacity consistent with a combination of pneumonia and small to moderate pleural effusion.  Electronically Signed   By: Lajean Manes M.D.   On: 10/01/2014 22:53   Medications:  I have reviewed the patient's current medications. Prior to Admission:  Prescriptions prior to admission  Medication Sig Dispense Refill Last Dose  . acetaminophen (TYLENOL) 325 MG tablet Take 2 tablets (650 mg total) by mouth every 6 (six) hours as needed for mild pain (or Fever >/= 101). 30 tablet 0 unk  . amLODipine (NORVASC) 5 MG tablet Take 1 tablet (5 mg total) by mouth daily. 90 tablet 1 09/28/2014 at Unknown time  . amoxicillin-clavulanate (AUGMENTIN) 500-125 MG per tablet Take 1 tablet by mouth every 12 (twelve) hours. For 7 days   10/04/2014 at  Unknown time  . apixaban (ELIQUIS) 2.5 MG TABS tablet Take 1 tablet (2.5 mg total) by mouth 2 (two) times daily. 180 tablet 1 09/21/2014 at Unknown time  . benazepril (LOTENSIN) 20 MG tablet Take 1 tablet (20 mg total) by mouth daily. 90 tablet 1 09/20/2014 at Unknown time  . furosemide (LASIX) 40 MG tablet Take 1 tablet (40 mg total) by mouth daily. 90 tablet 1 09/16/2014 at Unknown time  . guaiFENesin-dextromethorphan (ROBITUSSIN DM) 100-10 MG/5ML syrup Take 10 mLs by mouth every 6 (six) hours as needed for cough.   09/17/2014 at Unknown time  . hydrALAZINE (APRESOLINE) 25 MG tablet Take 1 tablet (25 mg total) by mouth 4 (four) times daily. 360 tablet 1 09/25/2014 at Unknown time  . ipratropium-albuterol (DUONEB) 0.5-2.5 (3) MG/3ML SOLN Take 3 mLs by nebulization every 6 (six) hours as needed.   09/15/2014 at Unknown time  . oxyCODONE (OXY IR/ROXICODONE) 5 MG immediate release tablet Take one tablet by mouth every six hours for pain (Patient taking differently: Take 5 mg by mouth 3 (three) times daily. ) 120 tablet 0 09/29/2014 at Unknown time  . potassium chloride SA (KLOR-CON M20) 20 MEQ tablet Take 1 tablet (20 mEq total) by mouth daily. 90 tablet 1 09/26/2014 at Unknown time  . Sennosides-Docusate Sodium (SENNA PLUS PO) Take 1 tablet by mouth 2 (two) times daily.    09/18/2014 at Unknown time   Scheduled Meds: . antiseptic oral rinse  7 mL Mouth Rinse BID  . ceFEPime (MAXIPIME) IV  500 mg Intravenous Q24H  . folic acid  1 mg Oral Daily  . heparin subcutaneous  5,000 Units Subcutaneous 3 times per day  . multivitamin with minerals  1 tablet Oral Daily  . senna-docusate  1 tablet Oral BID  . sodium chloride  3 mL Intravenous Q12H  . thiamine  100 mg Oral Daily  . [START ON 10/06/2014] vancomycin  500 mg Intravenous Q72H   Continuous Infusions: . sodium chloride 100 mL/hr at 10/03/14 2235   PRN Meds:.acetaminophen **OR** acetaminophen, ipratropium-albuterol, ondansetron **OR** ondansetron (ZOFRAN)  IV Assessment/Plan: Principal Problem:   RLL pneumonia Active Problems:   Atrial fibrillation   AKI (acute kidney injury)   Hyperkalemia   Elevated glucose  HCAP versus Aspiration Pneumonia: CXR revealed new right lower lung zone opacity consistent with a combination of pneumonia and small to moderate pleural effusion. Patient satting 88-96% on 4 L O2. Patient no longer has agonal respirations, but is tachypneic. She remained stable overnight and is more arousable this morning.  -Cefepime 500 mg daily -Vanc 500 mg Q72 hours -Albuterol nebulizer -Duonebs Q6H prn -Cardiac monitor -Diet Carb Mod -DNR -Legionella antigen pending -Supplemental oxygen -NS 75 cc/hr  Hyperkalemia: K 6.4 this morning after receiving treatment yesterday. Patient was given insulin, D50, calcium,  albuterol. EKG showed atrial fibrillation, no peaked T waves.  -Repeat BMET tomorrow am -Cardiac monitor  AKI: Patient has acute on chronic kidney failure with creatinine of 3.27 with baseline of 1.5. FENa is 0.2% indicating a pre-renal etiology. Nephrology is following and states ATN versus prerenal and AKI worsening. Nephrology agrees with gentle IVF of 75 cc/hr, holding ACEI, and low K renal diet. -NS 75 cc/hr -Low K Renal Diet, however patient has not been eating  HTN: Hypotensive at 110/24-116/26. Patient is on Amlodipine 5 mg daily, Benzazepril 20 mg daily, Lasix 40 mg daily, and Hydralazine 25 mg QID at home.  -Hold all BP meds  Chronic Diastolic CHF: Patient is on Lasix 40 mg daily, Kdur 20 mEq daily and Benazepril 20 mg daily at home.  -Hold all BP meds  Atrial Fibrillation: Patient has a history of atrial fibrillation and is on Eliquis 2.5 mg BID at home. Patient is currently in atrial fibrillation, but rate controlled.  -Continue to hold po meds until less lethargic -On Heparin  DVT/PE ppx: Heparin SQ TID  Dispo: Disposition is deferred at this time, awaiting improvement of current medical problems.   Anticipated discharge in approximately 1-2 day(s).   The patient does have a current PCP Juanda Bond Altheimer, MD) and does need an [facility] hospital follow-up appointment after discharge.  The patient does not have transportation limitations that hinder transportation to clinic appointments.  .Services Needed at time of discharge: Y = Yes, Blank = No PT:   OT:   RN:   Equipment:   Other:     LOS: 2 days   Osa Craver, DO PGY-1 Internal Medicine Resident Pager # [phone] 10/04/2014 6:56 AM

## 2014-10-04 NOTE — Progress Notes (Signed)
CRITICAL VALUE ALERT  Critical value received:  K-6.4  Date of notification:  10/04/2014  Time of notification:  3:36 AM  Critical value read back:Yes.    Nurse who received alert:  Dillard Essex  MD notified (1st page):  Dr. Ethelene Hal  Time of first page:  3:36 AM  MD notified (2nd page):  Time of second page:  Responding MD:  Dr. Ethelene Hal  Time MD responded:  3:36 AM

## 2014-10-04 NOTE — Progress Notes (Signed)
  [facility] Progress Note   Subjective: more responsive today per dtr, squeezed hands and verbalized minimally.  Nasal O2 increased for sat less than 90, K 6.4 today  Filed Vitals:   10/04/14 0400 10/04/14 0411 10/04/14 0600 10/04/14 0800  BP: 116/26  110/24 101/24  Pulse: 71  67 71  Temp: 98 F (36.7 C)   97.2 F (36.2 C)  TempSrc: Oral   Oral  Resp: $Remo'31  27 26  'KLcye$ Height:      Weight:      SpO2: 91% 96% 88% 92%   Exam: Elderly WF tachypneic, follows simple commands No jvd Chest minimal air movement R lung ant/lat, L lung +rhonchi Cor irreg rhythm, no MRG Abd soft, protuberant a bit, +BS, no mass GU foley draining yellow clear urine No edema UE or LE Neuro moving arms occ, responds to simple commands   Assessment: 1. Acute on CKD4 (baseline creat 1.5, baseline eGFR 28 ml/min) - prob ATN or prerenal, no better, getting IVF"s appropriately, ACEi on hold 2. Hyperkalemia - still high but down some, no EKG changes 3. PNA on IV abx - requiring ^'d nasal O2 this am. Resp status remains marginal.  4. DNR 5. HTN - BP's, holding meds 6. Afib  Plan - cont IVF 75/hr, urine lytes, low K renal diet for now, d/w family cont conservative managment    Kelly Splinter MD  pager [phone]    cell [phone]  10/04/2014, 9:19 AM     Recent Labs Lab 10/03/14 1244 10/03/14 1554 10/04/14 0245  NA 137 140 141  K 6.3* 6.3* 6.4*  CL 104 101 105  CO2 $Re'22 28 26  'saz$ GLUCOSE 258* 187* 141*  BUN 75* 78* 84*  CREATININE 2.65* 2.92* 3.27*  CALCIUM 8.7 9.8 9.0    Recent Labs Lab 10/09/2014 2323  AST 25  ALT 16  ALKPHOS 150*  BILITOT 0.9  PROT 6.6  ALBUMIN 3.1*    Recent Labs Lab 10/03/2014 2323  WBC 8.8  NEUTROABS 7.5  HGB 11.4*  HCT 36.2  MCV 102.3*  PLT 251   . antiseptic oral rinse  7 mL Mouth Rinse BID  . ceFEPime (MAXIPIME) IV  500 mg Intravenous Q24H  . folic acid  1 mg Oral Daily  . heparin subcutaneous  5,000 Units Subcutaneous 3 times per day  .  multivitamin with minerals  1 tablet Oral Daily  . senna-docusate  1 tablet Oral BID  . sodium chloride  3 mL Intravenous Q12H  . thiamine  100 mg Oral Daily  . [START ON 10/06/2014] vancomycin  500 mg Intravenous Q72H   . sodium chloride 75 mL/hr at 10/04/14 0730   acetaminophen **OR** acetaminophen, ipratropium-albuterol, ondansetron **OR** ondansetron (ZOFRAN) IV

## 2014-10-05 LAB — BASIC METABOLIC PANEL
ANION GAP: 8 (ref 5–15)
BUN: 96 mg/dL — ABNORMAL HIGH (ref 6–23)
CO2: 26 mmol/L (ref 19–32)
CREATININE: 4.37 mg/dL — AB (ref 0.50–1.10)
Calcium: 8.3 mg/dL — ABNORMAL LOW (ref 8.4–10.5)
Chloride: 110 mmol/L (ref 96–112)
GFR calc Af Amer: 9 mL/min — ABNORMAL LOW (ref >90)
GFR calc non Af Amer: 8 mL/min — ABNORMAL LOW (ref >90)
GLUCOSE: 163 mg/dL — AB (ref 70–99)
POTASSIUM: 6.5 mmol/L — AB (ref 3.5–5.1)
Sodium: 144 mmol/L (ref 135–145)

## 2014-10-05 LAB — VANCOMYCIN, RANDOM: Vancomycin Rm: 17.2 ug/mL

## 2014-10-05 LAB — GLUCOSE, CAPILLARY: GLUCOSE-CAPILLARY: 143 mg/dL — AB (ref 70–99)

## 2014-10-05 MED ORDER — CETYLPYRIDINIUM CHLORIDE 0.05 % MT LIQD
7.0000 mL | Freq: Two times a day (BID) | OROMUCOSAL | Status: DC
  Administered 2014-10-05: 7 mL via OROMUCOSAL

## 2014-10-05 MED ORDER — CHLORHEXIDINE GLUCONATE 0.12 % MT SOLN
15.0000 mL | Freq: Two times a day (BID) | OROMUCOSAL | Status: DC
  Administered 2014-10-05: 15 mL via OROMUCOSAL
  Filled 2014-10-05: qty 15

## 2014-10-07 LAB — LEGIONELLA ANTIGEN, URINE

## 2014-10-09 LAB — CULTURE, BLOOD (ROUTINE X 2)
CULTURE: NO GROWTH
CULTURE: NO GROWTH

## 2014-10-13 NOTE — Progress Notes (Signed)
Subjective: Patient unresponsive and tachypneic this AM on NRB. Does not appear uncomfortable. Quite hypotensive this AM. Daughter at bedside, understands poor prognosis at this time. Anuric. Has had 3 bowel movements overnight.   Objective: Vital signs in last 24 hours: Filed Vitals:   10-15-2014 0400 2014-10-15 0500 10/15/14 0600 10/15/14 0729  BP: 70/28  70/21 72/24  Pulse: 68 61 64 67  Temp: 97 F (36.1 C)   96.5 F (35.8 C)  TempSrc: Axillary   Axillary  Resp: 34 35 38 28  Height:      Weight:      SpO2: 99% 98% 98% 99%   Weight change:   Intake/Output Summary (Last 24 hours) at 2014/10/15 0752 Last data filed at Oct 15, 2014 0600  Gross per 24 hour  Intake 2396.67 ml  Output      0 ml  Net 2396.67 ml   Physical Exam: General: Elderly white female, unresponsive, NAD. HEENT: PERRL. Moist mucus membranes Neck: Supple, no lymphadenopathy or carotid bruits Lungs: Poor air flow bilaterally. Coarse breath sounds throughout. Tachypneic.  Heart: Normal rate, irregular rhythm, no murmurs, gallops, or rubs Abdomen: Soft, mildly distended, BS + Extremities: No cyanosis or clubbing. Trace edema.  Neurologic: Unresponsive, unable to fully assess neuro status.      Lab Results: Basic Metabolic Panel:  Recent Labs Lab 10/04/14 0245 Oct 15, 2014 0300  NA 141 144  K 6.4* 6.5*  CL 105 110  CO2 26 26  GLUCOSE 141* 163*  BUN 84* 96*  CREATININE 3.27* 4.37*  CALCIUM 9.0 8.3*   Liver Function Tests:  Recent Labs Lab 10/03/2014 2323  AST 25  ALT 16  ALKPHOS 150*  BILITOT 0.9  PROT 6.6  ALBUMIN 3.1*   CBC:  Recent Labs Lab 09/28/2014 2323  WBC 8.8  NEUTROABS 7.5  HGB 11.4*  HCT 36.2  MCV 102.3*  PLT 251   Cardiac Enzymes:  Recent Labs Lab 09/22/2014 2323  TROPONINI <0.03   CBG:  Recent Labs Lab 10/03/14 0144 10/03/14 0805 10/04/14 0831  GLUCAP 261* 143* 139*   Hemoglobin A1C:  Recent Labs Lab 10/03/14 0215  HGBA1C 5.6   Thyroid Function  Tests:  Recent Labs Lab 10/03/14 0245  TSH 1.057   Urinalysis:  Recent Labs Lab 10/03/14 1456  COLORURINE AMBER*  LABSPEC 1.017  PHURINE 5.0  GLUCOSEU NEGATIVE  HGBUR NEGATIVE  BILIRUBINUR NEGATIVE  KETONESUR NEGATIVE  PROTEINUR NEGATIVE  UROBILINOGEN 0.2  NITRITE NEGATIVE  LEUKOCYTESUR NEGATIVE   Micro Results: Recent Results (from the past 240 hour(s))  Culture, blood (routine x 2)     Status: None (Preliminary result)   Collection Time: 09/20/2014 11:22 PM  Result Value Ref Range Status   Specimen Description BLOOD FOREARM LEFT  Final   Special Requests BOTTLES DRAWN AEROBIC AND ANAEROBIC 5CC  Final   Culture   Final           BLOOD CULTURE RECEIVED NO GROWTH TO DATE CULTURE WILL BE HELD FOR 5 DAYS BEFORE ISSUING A FINAL NEGATIVE REPORT Performed at [facility]    Report Status PENDING  Incomplete  Culture, blood (routine x 2)     Status: None (Preliminary result)   Collection Time: 10/07/2014 11:30 PM  Result Value Ref Range Status   Specimen Description BLOOD ARM RIGHT  Final   Special Requests BOTTLES DRAWN AEROBIC AND ANAEROBIC 10CC  Final   Culture   Final           BLOOD CULTURE RECEIVED NO GROWTH  TO DATE CULTURE WILL BE HELD FOR 5 DAYS BEFORE ISSUING A FINAL NEGATIVE REPORT Note: Culture results may be compromised due to an excessive volume of blood received in culture bottles. Performed at [facility]    Report Status PENDING  Incomplete  MRSA PCR Screening     Status: None   Collection Time: 10/03/14  2:15 AM  Result Value Ref Range Status   MRSA by PCR NEGATIVE NEGATIVE Final    Comment:        The GeneXpert MRSA Assay (FDA approved for NASAL specimens only), is one component of a comprehensive MRSA colonization surveillance program. It is not intended to diagnose MRSA infection nor to guide or monitor treatment for MRSA infections.   Urine culture     Status: None   Collection Time: 10/03/14  3:02 PM  Result Value Ref Range  Status   Specimen Description URINE, RANDOM  Final   Special Requests NONE  Final   Colony Count NO GROWTH Performed at [facility]   Final   Culture NO GROWTH Performed at [facility]   Final   Report Status 10/04/2014 FINAL  Final   Studies/Results: Dg Chest Port 1v Same Day  10/04/2014   CLINICAL DATA:  Pneumonia  EXAM: PORTABLE CHEST - 1 VIEW SAME DAY  COMPARISON:  09/24/2014  FINDINGS: Elevation of the right hemidiaphragm is again seen. Blunting of the right costophrenic angle is again noted. The left lung remains clear. Cardiac shadow is stable. No acute bony abnormality is seen.  IMPRESSION: Increasing right-sided pleural effusion.   Electronically Signed   By: Inez Catalina M.D.   On: 10/04/2014 11:49   Medications:  I have reviewed the patient's current medications. Prior to Admission:  Prescriptions prior to admission  Medication Sig Dispense Refill Last Dose  . acetaminophen (TYLENOL) 325 MG tablet Take 2 tablets (650 mg total) by mouth every 6 (six) hours as needed for mild pain (or Fever >/= 101). 30 tablet 0 unk  . amLODipine (NORVASC) 5 MG tablet Take 1 tablet (5 mg total) by mouth daily. 90 tablet 1 10/10/2014 at Unknown time  . amoxicillin-clavulanate (AUGMENTIN) 500-125 MG per tablet Take 1 tablet by mouth every 12 (twelve) hours. For 7 days   09/19/2014 at Unknown time  . apixaban (ELIQUIS) 2.5 MG TABS tablet Take 1 tablet (2.5 mg total) by mouth 2 (two) times daily. 180 tablet 1 09/17/2014 at Unknown time  . benazepril (LOTENSIN) 20 MG tablet Take 1 tablet (20 mg total) by mouth daily. 90 tablet 1 10/07/2014 at Unknown time  . furosemide (LASIX) 40 MG tablet Take 1 tablet (40 mg total) by mouth daily. 90 tablet 1 09/20/2014 at Unknown time  . guaiFENesin-dextromethorphan (ROBITUSSIN DM) 100-10 MG/5ML syrup Take 10 mLs by mouth every 6 (six) hours as needed for cough.   10/01/2014 at Unknown time  . hydrALAZINE (APRESOLINE) 25 MG tablet Take 1 tablet (25 mg  total) by mouth 4 (four) times daily. 360 tablet 1 10/04/2014 at Unknown time  . ipratropium-albuterol (DUONEB) 0.5-2.5 (3) MG/3ML SOLN Take 3 mLs by nebulization every 6 (six) hours as needed.   10/07/2014 at Unknown time  . oxyCODONE (OXY IR/ROXICODONE) 5 MG immediate release tablet Take one tablet by mouth every six hours for pain (Patient taking differently: Take 5 mg by mouth 3 (three) times daily. ) 120 tablet 0 09/29/2014 at Unknown time  . potassium chloride SA (KLOR-CON M20) 20 MEQ tablet Take 1 tablet (20 mEq  total) by mouth daily. 90 tablet 1 09/24/2014 at Unknown time  . Sennosides-Docusate Sodium (SENNA PLUS PO) Take 1 tablet by mouth 2 (two) times daily.    09/18/2014 at Unknown time   Scheduled Meds: . antiseptic oral rinse  7 mL Mouth Rinse q12n4p  . ceFEPime (MAXIPIME) IV  500 mg Intravenous Q24H  . chlorhexidine  15 mL Mouth Rinse BID  . folic acid  1 mg Oral Daily  . heparin subcutaneous  5,000 Units Subcutaneous 3 times per day  . multivitamin with minerals  1 tablet Oral Daily  . sodium chloride  3 mL Intravenous Q12H  . thiamine  100 mg Oral Daily  . [START ON 10/06/2014] vancomycin  500 mg Intravenous Q72H   Continuous Infusions: . sodium chloride 125 mL/hr at 10/21/2014 0526   PRN Meds:.acetaminophen **OR** acetaminophen, ipratropium-albuterol, morphine injection, ondansetron **OR** ondansetron (ZOFRAN) IV   Assessment/Plan: 81 y/o F w/ PMHx of HTN, HLD, atrial fibrillation, chronic dCHF, and recent right pelvic rami fracture, admitted from SNF for HCAP and acute renal failure w/ hyperkalemia.   HCAP: CXR shows worsening right-sided pleural effusion yesterday. Patient very tachypneic on exam, unresponsive, requiring NRB mask. Prognosis poor at this time. Patient is DNR. Do not feel that she would benefit from BiPAP, have discussed this w/ family. Given patient's mental status, poor candidate for NIPPV. Daughter understands likely outcome and poor prognosis. States her most  important goal is to keep patient comfortable.  -Continue Vancomycin + Cefepime for now -Continue supplemental oxygen -Morphine 1 mg q3h prn for tachypnea.  -Continue IVF; NS @125  cc/hr  Hyperkalemia: K 6.5 this morning.  -Repeat EKG  AKI: Patient oliguric/anuric at this time. Cr continues to worsen. Trend as follows:   Recent Labs Lab 10/03/14 0925 10/03/14 1244 10/03/14 1554 10/04/14 0245 2014/10/21 0300  CREATININE 2.67* 2.65* 2.92* 3.27* 4.37*  -Continue IVF; NS 75 cc/hr  HTN: Quite hypotensive this AM.  -Hold all BP meds -IVF as above  Chronic Diastolic CHF: Patient is on Lasix 40 mg daily, Kdur 20 mEq daily and Benazepril 20 mg daily at home.  -Hold all BP meds  Atrial Fibrillation: Patient is currently in atrial fibrillation, but rate controlled.  -Continue to hold po meds  DVT/PE ppx: Heparin [place]  Dispo: Patient w/ poor prognosis, disposition unclear at this time.   The patient does have a current PCP Juanda Bond Altheimer, MD) and does need an [facility] hospital follow-up appointment after discharge.  The patient does not have transportation limitations that hinder transportation to clinic appointments.  .Services Needed at time of discharge: Y = Yes, Blank = No PT:   OT:   RN:   Equipment:   Other:     LOS: 3 days   Signed: Luanne Bras, MD 10-21-14 7:54 AM

## 2014-10-13 NOTE — Discharge Summary (Signed)
  Name: Debbie Spears MRN: [id number] DOB: 10-Feb-1912 81 y.o.  Date of Admission: 09/16/2014 10:27 PM Date of Discharge: 10/30/2014 Attending Physician: Aldine Contes, MD  Discharge Diagnosis: HCAP Acute Renal Failure Hyperkalemia  Cause of death: Acute Respiratory Failure 2/2 HCAP Time of death: 3:45 PM  Disposition and follow-up:   Debbie Spears was discharged from [facility] in expired condition.    Hospital Course: Patient admitted on 10/03/14 by hospitalist team, transferred to IMTS w/ diagnosis of HCAP. Patient was recently discharged to SNF after right pelvic rami fracture and returned w/ cough, congestion, poor po intake, and CXR suggestive of right lower lung zone opacity consistent with a combination of pneumonia and small to moderate pleural effusion. Patient was started on Vancomycin + Cefepime. Patient also noted to have AKI w/ hyperkalemia on admission, and subsequently developed oliguria and anuria on 10/04/14. Patient received insulin, D50, calcium gluconate, and albuterol nebulizer to temporarily control hyperkalemia. Also recieved one dose of Kayexalate on admission but was unable to tolerate further Kayexalate d/t deteriorating mental status. Patient continued to develop increased oxygen demand and was placed on a NRB mask on 10/30/2014. On the afternoon of 1/24, patient was made comfort care and passed away at 3:45 PM. Daughter at bedside.   Signed: Corky Sox, MD 10/30/2014, 4:12 PM

## 2014-10-13 NOTE — Progress Notes (Signed)
  [facility] Progress Note   Subjective: more responsive today per dtr, squeezed hands and verbalized minimally.  Nasal O2 increased for sat less than   Filed Vitals:   10/21/14 0600 October 21, 2014 0700 October 21, 2014 0729 October 21, 2014 0800  BP: 70/21  72/24 68/28  Pulse: 64 63 67 65  Temp:   96.5 F (35.8 C)   TempSrc:   Axillary   Resp: 38 37 28 35  Height:      Weight:      SpO2: 98% 98% 99% 98%   Exam: Elderly WF tachypneic, follows simple commands No jvd Chest minimal air movement R lung ant/lat, L lung +rhonchi Cor irreg rhythm, no MRG Abd soft, protuberant a bit, +BS, no mass GU foley draining yellow clear urine No edema UE or LE Neuro moving arms occ, responds to simple commands   Assessment: 1. Acute on CKD4 -progressive, now very hypotensive and creat continues to rise 2. Hyperkalemia - no change 3. Hypotension -prob shock from infection 4. PNA on IV abx - on FM O2 5. DNR 6. HTN - BP's, holding meds 7. Afib  Plan - continues to decline, have d/w family, they are open to transitioning to full comfort care, defer to primary team. Will sign off    Kelly Splinter MD  pager [phone]    cell [phone]  10/21/14, 9:55 AM     Recent Labs Lab 10/03/14 1554 10/04/14 0245 10/21/14 0300  NA 140 141 144  K 6.3* 6.4* 6.5*  CL 101 105 110  CO2 28 26 26   GLUCOSE 187* 141* 163*  BUN 78* 84* 96*  CREATININE 2.92* 3.27* 4.37*  CALCIUM 9.8 9.0 8.3*    Recent Labs Lab 09/28/2014 2323  AST 25  ALT 16  ALKPHOS 150*  BILITOT 0.9  PROT 6.6  ALBUMIN 3.1*    Recent Labs Lab 09/17/2014 2323  WBC 8.8  NEUTROABS 7.5  HGB 11.4*  HCT 36.2  MCV 102.3*  PLT 251   . antiseptic oral rinse  7 mL Mouth Rinse q12n4p  . ceFEPime (MAXIPIME) IV  500 mg Intravenous Q24H  . chlorhexidine  15 mL Mouth Rinse BID  . folic acid  1 mg Oral Daily  . heparin subcutaneous  5,000 Units Subcutaneous 3 times per day  . multivitamin with minerals  1 tablet Oral Daily  . sodium  chloride  3 mL Intravenous Q12H  . thiamine  100 mg Oral Daily  . [START ON 10/06/2014] vancomycin  500 mg Intravenous Q72H   . sodium chloride 125 mL/hr at 10/21/14 0526   acetaminophen **OR** acetaminophen, ipratropium-albuterol, morphine injection, ondansetron **OR** ondansetron (ZOFRAN) IV

## 2014-10-13 NOTE — Progress Notes (Signed)
Internal On-Call Medicine Attending  Date: October 26, 2014  Patient name: Debbie Spears Medical record number: [id number] Date of birth: 1911-11-08 Age: 82 y.o. Gender: female  I saw and evaluated the patient. I discussed patient and reviewed the resident's note by Dr. Ronnald Ramp, and I agree with the resident's findings and plans as documented in his note, with the following additional comments.  Patient is hypotensive, unresponsive, with worsening renal failure; prognosis is very poor.  Patient's daughter has decided to transition to full comfort care.

## 2014-10-13 NOTE — Progress Notes (Signed)
Report given to RN 6 N 17

## 2014-10-13 NOTE — Progress Notes (Signed)
Pt arrived to [facility] at 1508. Pt was pronounced dead at 1545 by Norva Karvonen, RN and Shelbie Proctor, RN. Pt had family at the bedside with her at the time of death.

## 2014-10-13 DEATH — deceased

## 2015-07-15 ENCOUNTER — Ambulatory Visit (INDEPENDENT_AMBULATORY_CARE_PROVIDER_SITE_OTHER): Payer: Medicare Other | Admitting: Ophthalmology
